# Patient Record
Sex: Male | Born: 1965 | Race: White | Hispanic: No | Marital: Married | Smoking: Former smoker
Health system: Southern US, Community
[De-identification: ages and names within clinical notes are randomized; demographics above are authoritative.]

## PROBLEM LIST (undated history)

## (undated) DIAGNOSIS — I7100 Dissection of unspecified site of aorta: Secondary | ICD-10-CM

## (undated) DIAGNOSIS — I499 Cardiac arrhythmia, unspecified: Secondary | ICD-10-CM

## (undated) DIAGNOSIS — N200 Calculus of kidney: Secondary | ICD-10-CM

## (undated) DIAGNOSIS — E785 Hyperlipidemia, unspecified: Secondary | ICD-10-CM

## (undated) DIAGNOSIS — R040 Epistaxis: Secondary | ICD-10-CM

## (undated) DIAGNOSIS — E119 Type 2 diabetes mellitus without complications: Secondary | ICD-10-CM

## (undated) DIAGNOSIS — Z87442 Personal history of urinary calculi: Secondary | ICD-10-CM

## (undated) DIAGNOSIS — I10 Essential (primary) hypertension: Secondary | ICD-10-CM

## (undated) DIAGNOSIS — I714 Abdominal aortic aneurysm, without rupture, unspecified: Secondary | ICD-10-CM

## (undated) HISTORY — PX: CYSTOSCOPY: SUR368

## (undated) HISTORY — DX: Dissection of unspecified site of aorta: I71.00

## (undated) HISTORY — DX: Abdominal aortic aneurysm, without rupture, unspecified: I71.40

## (undated) HISTORY — PX: LITHOTRIPSY: SUR834

## (undated) HISTORY — DX: Type 2 diabetes mellitus without complications: E11.9

## (undated) HISTORY — DX: Hyperlipidemia, unspecified: E78.5

## (undated) HISTORY — PX: HERNIA REPAIR: SHX51

## (undated) HISTORY — PX: KIDNEY STONE SURGERY: SHX686

---

## 2000-03-24 ENCOUNTER — Emergency Department (HOSPITAL_COMMUNITY): Admission: EM | Admit: 2000-03-24 | Discharge: 2000-03-24 | Payer: Self-pay | Admitting: Emergency Medicine

## 2010-05-30 ENCOUNTER — Emergency Department (HOSPITAL_BASED_OUTPATIENT_CLINIC_OR_DEPARTMENT_OTHER): Admission: EM | Admit: 2010-05-30 | Discharge: 2010-05-30 | Payer: Self-pay | Admitting: Emergency Medicine

## 2013-12-29 DIAGNOSIS — I10 Essential (primary) hypertension: Secondary | ICD-10-CM | POA: Insufficient documentation

## 2013-12-29 DIAGNOSIS — J309 Allergic rhinitis, unspecified: Secondary | ICD-10-CM | POA: Insufficient documentation

## 2013-12-29 DIAGNOSIS — G43909 Migraine, unspecified, not intractable, without status migrainosus: Secondary | ICD-10-CM | POA: Insufficient documentation

## 2016-12-29 DIAGNOSIS — N201 Calculus of ureter: Secondary | ICD-10-CM | POA: Insufficient documentation

## 2017-04-19 ENCOUNTER — Emergency Department (HOSPITAL_BASED_OUTPATIENT_CLINIC_OR_DEPARTMENT_OTHER)
Admission: EM | Admit: 2017-04-19 | Discharge: 2017-04-19 | Disposition: A | Discharge status: Home / Self Care | Payer: 59 | Attending: Emergency Medicine | Admitting: Emergency Medicine

## 2017-04-19 ENCOUNTER — Encounter (HOSPITAL_BASED_OUTPATIENT_CLINIC_OR_DEPARTMENT_OTHER): Payer: Self-pay | Admitting: *Deleted

## 2017-04-19 DIAGNOSIS — R040 Epistaxis: Secondary | ICD-10-CM | POA: Diagnosis not present

## 2017-04-19 DIAGNOSIS — Z79899 Other long term (current) drug therapy: Secondary | ICD-10-CM | POA: Diagnosis not present

## 2017-04-19 DIAGNOSIS — I10 Essential (primary) hypertension: Secondary | ICD-10-CM | POA: Diagnosis not present

## 2017-04-19 DIAGNOSIS — Z87891 Personal history of nicotine dependence: Secondary | ICD-10-CM | POA: Diagnosis not present

## 2017-04-19 HISTORY — DX: Essential (primary) hypertension: I10

## 2017-04-19 HISTORY — DX: Calculus of kidney: N20.0

## 2017-04-19 HISTORY — DX: Epistaxis: R04.0

## 2017-04-19 MED ORDER — OXYMETAZOLINE HCL 0.05 % NA SOLN
1.0000 | Freq: Once | NASAL | Status: AC
  Administered 2017-04-19: 1 via NASAL

## 2017-04-19 MED ORDER — OXYMETAZOLINE HCL 0.05 % NA SOLN
NASAL | Status: AC
  Filled 2017-04-19: qty 15

## 2017-04-19 MED ORDER — SILVER NITRATE-POT NITRATE 75-25 % EX MISC
CUTANEOUS | Status: AC
  Filled 2017-04-19: qty 4

## 2017-04-19 MED ORDER — SILVER NITRATE-POT NITRATE 75-25 % EX MISC
4.0000 | Freq: Once | CUTANEOUS | Status: AC
  Administered 2017-04-19: 4 via TOPICAL

## 2017-04-19 NOTE — ED Provider Notes (Signed)
[facility] Provider Note   CSN: [id number] Arrival date & time: 04/19/17  2046     History   Chief Complaint Chief Complaint  Patient presents with  . Epistaxis    HPI Patrick [name] is a 51 y.o. male.  Pt presents to the ED today with nose bleed out of left nare.  The pt has a hx of nose bleeds, but has never had to have one get packed.  The pt has had intermittent bleeding for the last few days.  He did have a ureteral stent placed today.  He is not on blood thinners.      Past Medical History:  Diagnosis Date  . Hypertension   . Kidney stone   . Nosebleed     There are no active problems to display for this patient.   Past Surgical History:  Procedure Laterality Date  . HERNIA REPAIR         Home Medications    Prior to Admission medications   Medication Sig Start Date End Date Taking? Authorizing Provider  amLODipine (NORVASC) 10 MG tablet Take 10 mg by mouth daily.   Yes [provider]  HYDROcodone-acetaminophen (NORCO/VICODIN) 5-325 MG tablet Take 1 tablet by mouth every 6 (six) hours as needed for moderate pain.   Yes [provider]  lisinopril (PRINIVIL,ZESTRIL) 20 MG tablet Take 20 mg by mouth daily.   Yes [provider]  nebivolol (BYSTOLIC) 10 MG tablet Take 10 mg by mouth daily.   Yes [provider]  phenazopyridine (PYRIDIUM) 100 MG tablet Take 100 mg by mouth 3 (three) times daily as needed for pain.   Yes [provider]  tamsulosin (FLOMAX) 0.4 MG CAPS capsule Take 0.4 mg by mouth.   Yes [provider]    Family History No family history on file.  Social History Social History  Substance Use Topics  . Smoking status: Former Games developer  . Smokeless tobacco: Not on file  . Alcohol use 1.2 oz/week    2 Cans of beer per week     Allergies   Patient has no known allergies.   Review of Systems Review of Systems  HENT: Positive for nosebleeds.   All other systems  reviewed and are negative.    Physical Exam Updated Vital Signs BP (!) 175/108   Pulse 80   Resp 18   Ht 5' 10.5" (1.791 m)   Wt 99.8 kg (220 lb)   SpO2 100%   BMI 31.12 kg/m   Physical Exam  Constitutional: He is oriented to person, place, and time. He appears well-developed and well-nourished.  HENT:  Head: Normocephalic and atraumatic.  Right Ear: External ear normal.  Left Ear: External ear normal.  Nose: Epistaxis is observed.  Mouth/Throat: Oropharynx is clear and moist.  Eyes: Pupils are equal, round, and reactive to light. Conjunctivae and EOM are normal.  Neck: Normal range of motion. Neck supple.  Cardiovascular: Normal rate, regular rhythm, normal heart sounds and intact distal pulses.   Pulmonary/Chest: Effort normal and breath sounds normal.  Abdominal: Soft. Bowel sounds are normal.  Musculoskeletal: Normal range of motion.  Neurological: He is alert and oriented to person, place, and time.  Skin: Skin is warm. Capillary refill takes less than 2 seconds.  Psychiatric: He has a normal mood and affect. His behavior is normal. Judgment and thought content normal.  Nursing note and vitals reviewed.    ED Treatments / Results  Labs (all labs ordered are listed,  but only abnormal results are displayed) Labs Reviewed - No data to display  EKG  EKG Interpretation None       Radiology No results found.  Procedures .Epistaxis Management Date/Time: 04/19/2017 10:13 PM Performed by: Jacalyn Lefevre Authorized by: Jacalyn Lefevre   Consent:    Consent obtained:  Verbal   Consent given by:  Patient   Risks discussed:  Bleeding, infection, nasal injury and pain   Alternatives discussed:  No treatment Anesthesia (see MAR for exact dosages):    Anesthesia method:  None Procedure details:    Treatment site:  L anterior   Treatment method:  Anterior pack and silver nitrate   Treatment complexity:  Limited   Treatment episode: recurring   Post-procedure  details:    Assessment:  No improvement   Patient tolerance of procedure:  Tolerated well, no immediate complications   (including critical care time)  Medications Ordered in ED Medications  silver nitrate applicators 75-25 % applicator (not administered)  oxymetazoline (AFRIN) 0.05 % nasal spray 1 spray (1 spray Each Nare Given 04/19/17 2105)  silver nitrate applicators applicator 4 Stick (4 Sticks Topical Given by Other 04/19/17 2115)     Initial Impression / Assessment and Plan / ED Course  I have reviewed the triage vital signs and the nursing notes.  Pertinent labs & imaging results that were available during my care of the patient were reviewed by me and considered in my medical decision making (see chart for details).    Pt ambulates without bleeding starting.  He is on levaquin already, so I did not put him on additional abx.  Pt is on pain meds for his ureter stent.  He is instructed to get packing removed in 2 days.  F/u with ENT.  Final Clinical Impressions(s) / ED Diagnoses   Final diagnoses:  Left-sided epistaxis    New Prescriptions New Prescriptions   No medications on file     Jacalyn Lefevre, MD 04/19/17 2221

## 2017-04-19 NOTE — ED Triage Notes (Addendum)
Pt c/o nose bleed x 10 mins

## 2017-04-19 NOTE — ED Notes (Signed)
ED Provider at bedside. 

## 2017-04-19 NOTE — ED Notes (Signed)
Pt amb around nursing station w/o bleeding

## 2017-06-16 LAB — HM COLONOSCOPY

## 2018-05-02 ENCOUNTER — Emergency Department (HOSPITAL_BASED_OUTPATIENT_CLINIC_OR_DEPARTMENT_OTHER)
Admission: EM | Admit: 2018-05-02 | Discharge: 2018-05-02 | Disposition: A | Discharge status: Home / Self Care | Payer: 59 | Attending: Emergency Medicine | Admitting: Emergency Medicine

## 2018-05-02 ENCOUNTER — Other Ambulatory Visit: Payer: Self-pay

## 2018-05-02 ENCOUNTER — Encounter (HOSPITAL_BASED_OUTPATIENT_CLINIC_OR_DEPARTMENT_OTHER): Payer: Self-pay

## 2018-05-02 ENCOUNTER — Emergency Department (HOSPITAL_BASED_OUTPATIENT_CLINIC_OR_DEPARTMENT_OTHER): Payer: 59

## 2018-05-02 DIAGNOSIS — Z79899 Other long term (current) drug therapy: Secondary | ICD-10-CM | POA: Diagnosis not present

## 2018-05-02 DIAGNOSIS — I10 Essential (primary) hypertension: Secondary | ICD-10-CM | POA: Insufficient documentation

## 2018-05-02 DIAGNOSIS — R208 Other disturbances of skin sensation: Secondary | ICD-10-CM | POA: Diagnosis present

## 2018-05-02 DIAGNOSIS — Z87891 Personal history of nicotine dependence: Secondary | ICD-10-CM | POA: Diagnosis not present

## 2018-05-02 DIAGNOSIS — R6889 Other general symptoms and signs: Secondary | ICD-10-CM

## 2018-05-02 LAB — CBC WITH DIFFERENTIAL/PLATELET
ABS IMMATURE GRANULOCYTES: 0.03 10*3/uL (ref 0.00–0.07)
Basophils Absolute: 0 10*3/uL (ref 0.0–0.1)
Basophils Relative: 0 %
Eosinophils Absolute: 0.1 10*3/uL (ref 0.0–0.5)
Eosinophils Relative: 2 %
HEMATOCRIT: 44.9 % (ref 39.0–52.0)
HEMOGLOBIN: 15.8 g/dL (ref 13.0–17.0)
IMMATURE GRANULOCYTES: 0 %
LYMPHS ABS: 1.8 10*3/uL (ref 0.7–4.0)
LYMPHS PCT: 21 %
MCH: 29.9 pg (ref 26.0–34.0)
MCHC: 35.2 g/dL (ref 30.0–36.0)
MCV: 85 fL (ref 80.0–100.0)
MONO ABS: 0.7 10*3/uL (ref 0.1–1.0)
MONOS PCT: 8 %
NEUTROS ABS: 6.1 10*3/uL (ref 1.7–7.7)
NEUTROS PCT: 69 %
NRBC: 0 % (ref 0.0–0.2)
PLATELETS: 161 10*3/uL (ref 150–400)
RBC: 5.28 MIL/uL (ref 4.22–5.81)
RDW: 12.2 % (ref 11.5–15.5)
WBC: 8.8 10*3/uL (ref 4.0–10.5)

## 2018-05-02 LAB — TSH: TSH: 1.53 u[IU]/mL (ref 0.350–4.500)

## 2018-05-02 LAB — BASIC METABOLIC PANEL
ANION GAP: 11 (ref 5–15)
BUN: 20 mg/dL (ref 6–20)
CALCIUM: 9.3 mg/dL (ref 8.9–10.3)
CHLORIDE: 100 mmol/L (ref 98–111)
CO2: 28 mmol/L (ref 22–32)
Creatinine, Ser: 0.96 mg/dL (ref 0.61–1.24)
GFR calc non Af Amer: >60 mL/min (ref >60)
GLUCOSE: 119 mg/dL — AB (ref 70–99)
POTASSIUM: 3.3 mmol/L — AB (ref 3.5–5.1)
Sodium: 139 mmol/L (ref 135–145)

## 2018-05-02 LAB — TROPONIN I
Troponin I: <0.03 ng/mL (ref <0.03)
Troponin I: <0.03 ng/mL (ref <0.03)

## 2018-05-02 MED ORDER — POTASSIUM CHLORIDE CRYS ER 20 MEQ PO TBCR
40.0000 meq | EXTENDED_RELEASE_TABLET | Freq: Once | ORAL | Status: AC
  Administered 2018-05-02: 40 meq via ORAL
  Filled 2018-05-02: qty 2

## 2018-05-02 NOTE — ED Notes (Signed)
Pt on monitor 

## 2018-05-02 NOTE — Discharge Instructions (Signed)
Follow-up with your primary doctor and/or cardiology to discuss further evaluation of your symptoms.

## 2018-05-02 NOTE — ED Notes (Signed)
Patient transported to X-ray 

## 2018-05-02 NOTE — ED Notes (Signed)
Attempted IV to R AC and R hand, tol well, unsuccessful

## 2018-05-02 NOTE — ED Provider Notes (Signed)
[facility] EMERGENCY DEPARTMENT Provider Note   CSN: [id number] Arrival date & time: 05/02/18  1152     History   Chief Complaint Chief Complaint  Patient presents with  . Hot Flashes    HPI Libero Puthoff is a 52 y.o. male.  Patient with history of high blood pressure, kidney stone, abnormal EKG for which she was seen by cardiology in years past and was cleared per his report presents with a few episodes of feeling extremely hot.  No diaphoresis or chest pain.  Mild palpitations.  These episodes were brief earlier today and the last one was prior to arrival.  No exertional symptoms.  Patient exercises regularly without chest pain or significant shortness of breath.  No blood clot history no recent surgeries.  Patient has had some weight changes however no thyroid history known.  Patient denies illegal drugs, regular alcohol use.     Past Medical History:  Diagnosis Date  . Hypertension   . Kidney stone   . Nosebleed     There are no active problems to display for this patient.   Past Surgical History:  Procedure Laterality Date  . HERNIA REPAIR          Home Medications    Prior to Admission medications   Medication Sig Start Date End Date Taking? Authorizing Provider  amLODipine (NORVASC) 10 MG tablet Take 10 mg by mouth daily.   Yes [provider]  lisinopril (PRINIVIL,ZESTRIL) 20 MG tablet Take 20 mg by mouth daily.   Yes [provider]  nebivolol (BYSTOLIC) 10 MG tablet Take 10 mg by mouth daily.   Yes [provider]  HYDROcodone-acetaminophen (NORCO/VICODIN) 5-325 MG tablet Take 1 tablet by mouth every 6 (six) hours as needed for moderate pain.    [provider]  phenazopyridine (PYRIDIUM) 100 MG tablet Take 100 mg by mouth 3 (three) times daily as needed for pain.    [provider]  tamsulosin (FLOMAX) 0.4 MG CAPS capsule Take 0.4 mg by mouth.    [provider]    Family History No family  history on file.  Social History Social History   Tobacco Use  . Smoking status: Former Games developer  . Smokeless tobacco: Never Used  Substance Use Topics  . Alcohol use: Yes    Comment: daily  . Drug use: No     Allergies   Patient has no known allergies.   Review of Systems Review of Systems  Constitutional: Negative for chills and fever.  HENT: Negative for congestion.   Eyes: Negative for visual disturbance.  Respiratory: Negative for shortness of breath.   Cardiovascular: Positive for palpitations. Negative for chest pain.  Gastrointestinal: Negative for abdominal pain and vomiting.  Genitourinary: Negative for dysuria and flank pain.  Musculoskeletal: Negative for back pain, neck pain and neck stiffness.  Skin: Negative for rash.  Neurological: Negative for syncope, light-headedness and headaches.     Physical Exam Updated Vital Signs BP (!) 147/83   Pulse (!) 44   Temp 97.7 F (36.5 C) (Oral)   Resp 13   Ht 5\' 10"  (1.778 m)   Wt 101.6 kg   SpO2 99%   BMI 32.14 kg/m   Physical Exam  Constitutional: He is oriented to person, place, and time. He appears well-developed and well-nourished.  HENT:  Head: Normocephalic and atraumatic.  Eyes: Conjunctivae are normal. Right eye exhibits no discharge. Left eye exhibits no discharge.  Neck: Normal range of motion. Neck supple.  No tracheal deviation present.  Cardiovascular: Regular rhythm and intact distal pulses. Bradycardia present.  No murmur heard. Pulmonary/Chest: Effort normal and breath sounds normal.  Abdominal: Soft. He exhibits no distension. There is no tenderness. There is no guarding.  Musculoskeletal: He exhibits no edema.  Neurological: He is alert and oriented to person, place, and time.  Skin: Skin is warm. No rash noted.  Psychiatric: He has a normal mood and affect.  Nursing note and vitals reviewed.    ED Treatments / Results  Labs (all labs ordered are listed, but only abnormal results  are displayed) Labs Reviewed  BASIC METABOLIC PANEL - Abnormal; Notable for the following components:      Result Value   Potassium 3.3 (*)    Glucose, Bld 119 (*)    All other components within normal limits  TROPONIN I  CBC WITH DIFFERENTIAL/PLATELET  CBC WITH DIFFERENTIAL/PLATELET  TSH  TROPONIN I    EKG EKG Interpretation  Date/Time:  Wednesday May 02 2018 12:11:34 EDT Ventricular Rate:  48 PR Interval:    QRS Duration: 91 QT Interval:  473 QTC Calculation: 423 R Axis:   76 Text Interpretation:  Sinus bradycardia Borderline repolarization abnormality Confirmed by Blane Ohara [phone]) on 05/02/2018 12:42:12 PM Also confirmed by Blane Ohara [phone])  on 05/02/2018 12:49:38 PM   Radiology Dg Chest 2 View  Result Date: 05/02/2018 CLINICAL DATA:  Cardiac palpitations EXAM: CHEST - 2 VIEW COMPARISON:  September 15, 2009 FINDINGS: There is no edema or consolidation. Heart is borderline enlarged with pulmonary vascularity normal. No adenopathy. Aorta is mildly tortuous. No evident adenopathy. No bone lesions. IMPRESSION: No edema or consolidation.  Heart borderline enlarged. Electronically Signed   By: Bretta Bang III M.D.   On: 05/02/2018 13:46    Procedures Procedures (including critical care time)  Medications Ordered in ED Medications  potassium chloride SA (K-DUR,KLOR-CON) CR tablet 40 mEq (has no administration in time range)     Initial Impression / Assessment and Plan / ED Course  I have reviewed the triage vital signs and the nursing notes.  Pertinent labs & imaging results that were available during my care of the patient were reviewed by me and considered in my medical decision making (see chart for details).     Well-appearing patient presents after episodes of feeling extremely hot these have since resolved.  No obvious clinical reason at this time.  Plan for cardiac screen, blood work reviewed no acute findings, thyroid testing pending.  Plan for  delta troponin.  Discussed patient will need further cardiac evaluation in addition to his primary care doctor. EKG is nonspecific findings, unable to visualize an old EKG.  Discussed this with this patient. Blood work reviewed mild hypokalemia oral potassium supplemented. Patient well-appearing the ER no chest pain.  Plan for delta troponin and if negative patient will follow closely with local cardiology/primary doctor. Final Clinical Impressions(s) / ED Diagnoses   Final diagnoses:  Sensation of feeling hot    ED Discharge Orders    None       Blane Ohara, MD 05/02/18 1459

## 2018-05-02 NOTE — ED Triage Notes (Signed)
Pt states he had episode today and yesterday where he feels flushed and turns red-to day he felt like he was going to pass out-denies pain-states he "feels anxious" denies hx of dx anxiety-NAD-steady gait

## 2018-06-19 DIAGNOSIS — E876 Hypokalemia: Secondary | ICD-10-CM | POA: Insufficient documentation

## 2018-12-18 DIAGNOSIS — N200 Calculus of kidney: Secondary | ICD-10-CM | POA: Insufficient documentation

## 2020-09-27 IMAGING — CR DG CHEST 2V
2 series · 2 of 2 positions shown · non-contrast
Comparison: September 15, 2009

CLINICAL DATA: Cardiac palpitations

EXAM:
CHEST - 2 VIEW

[w chest pa]
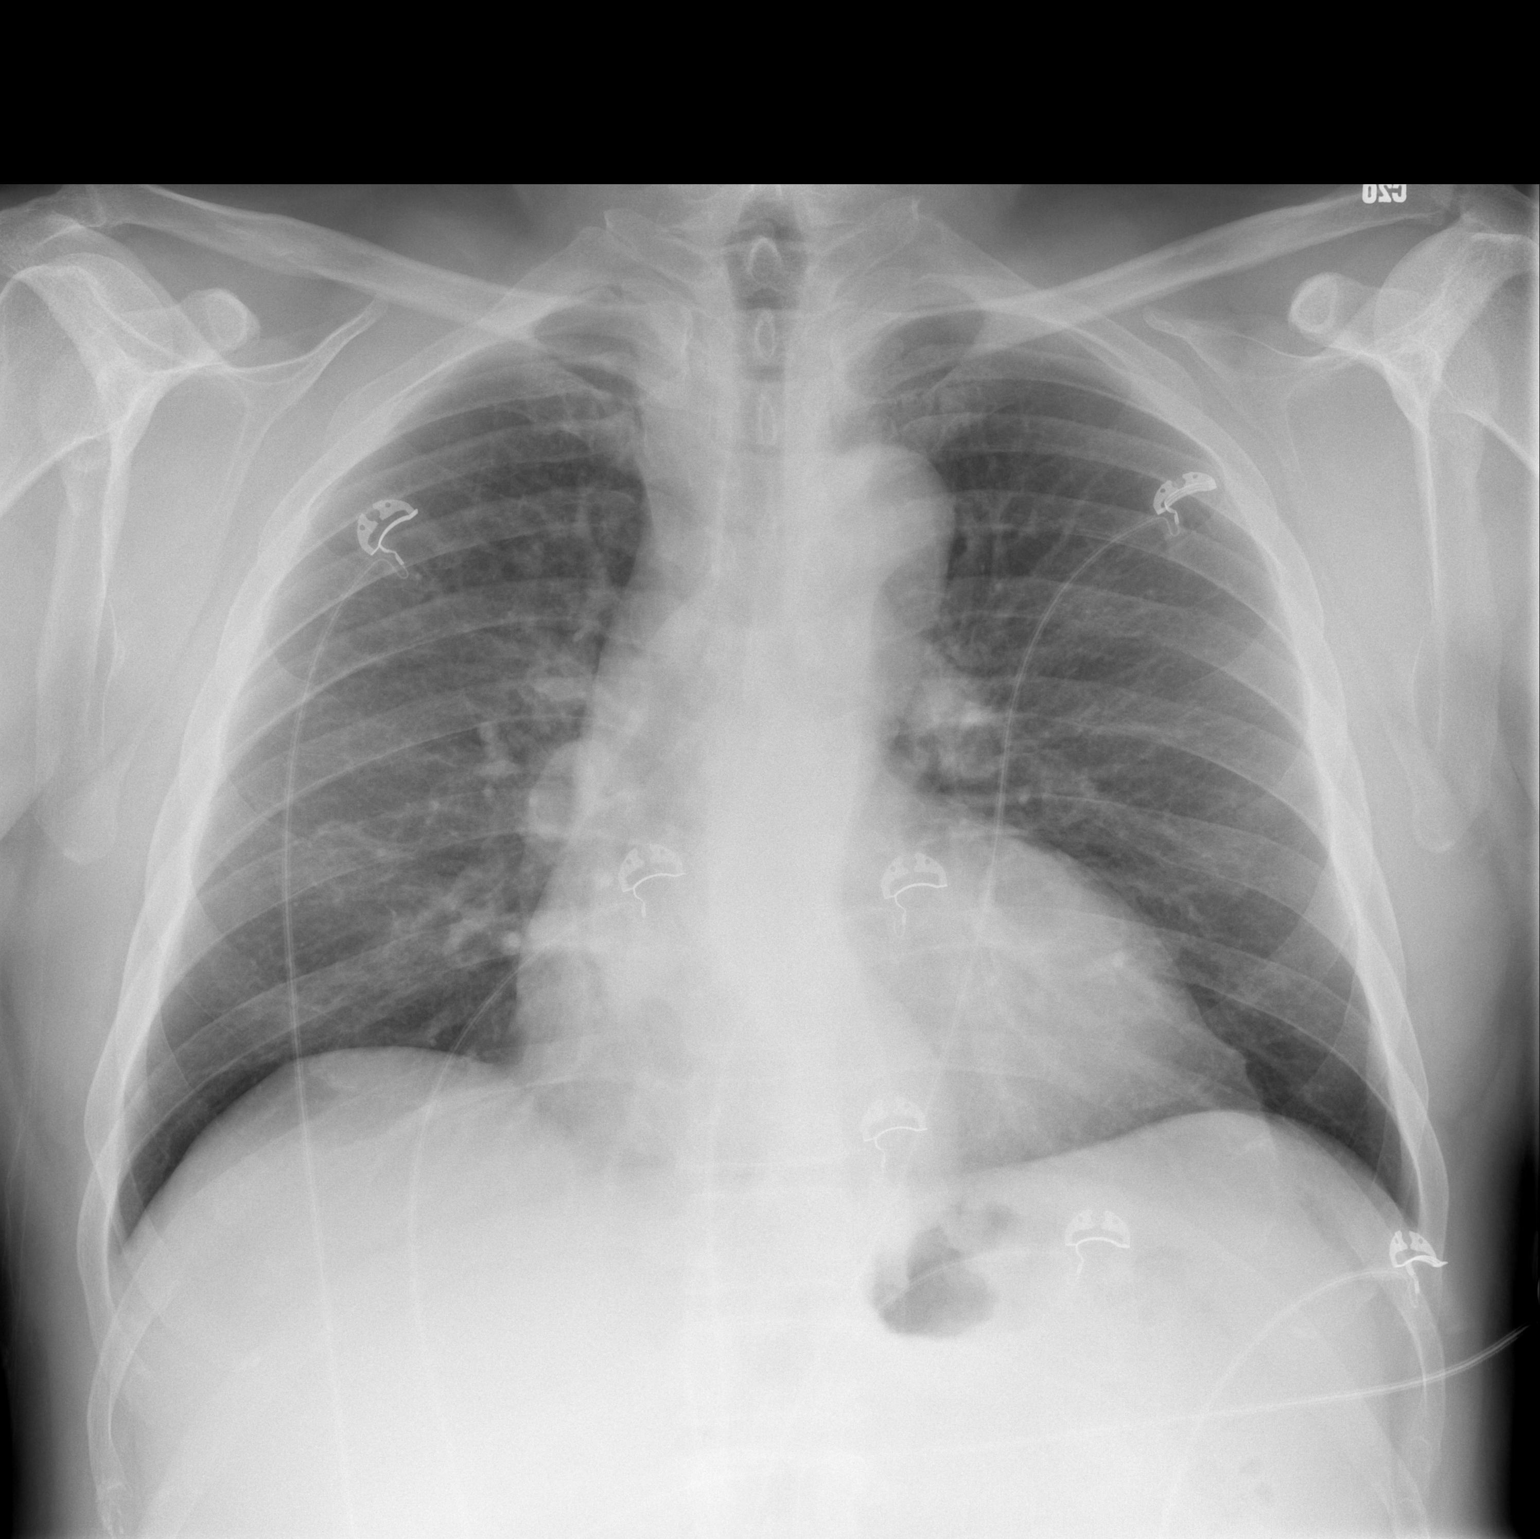

[w chest lat]
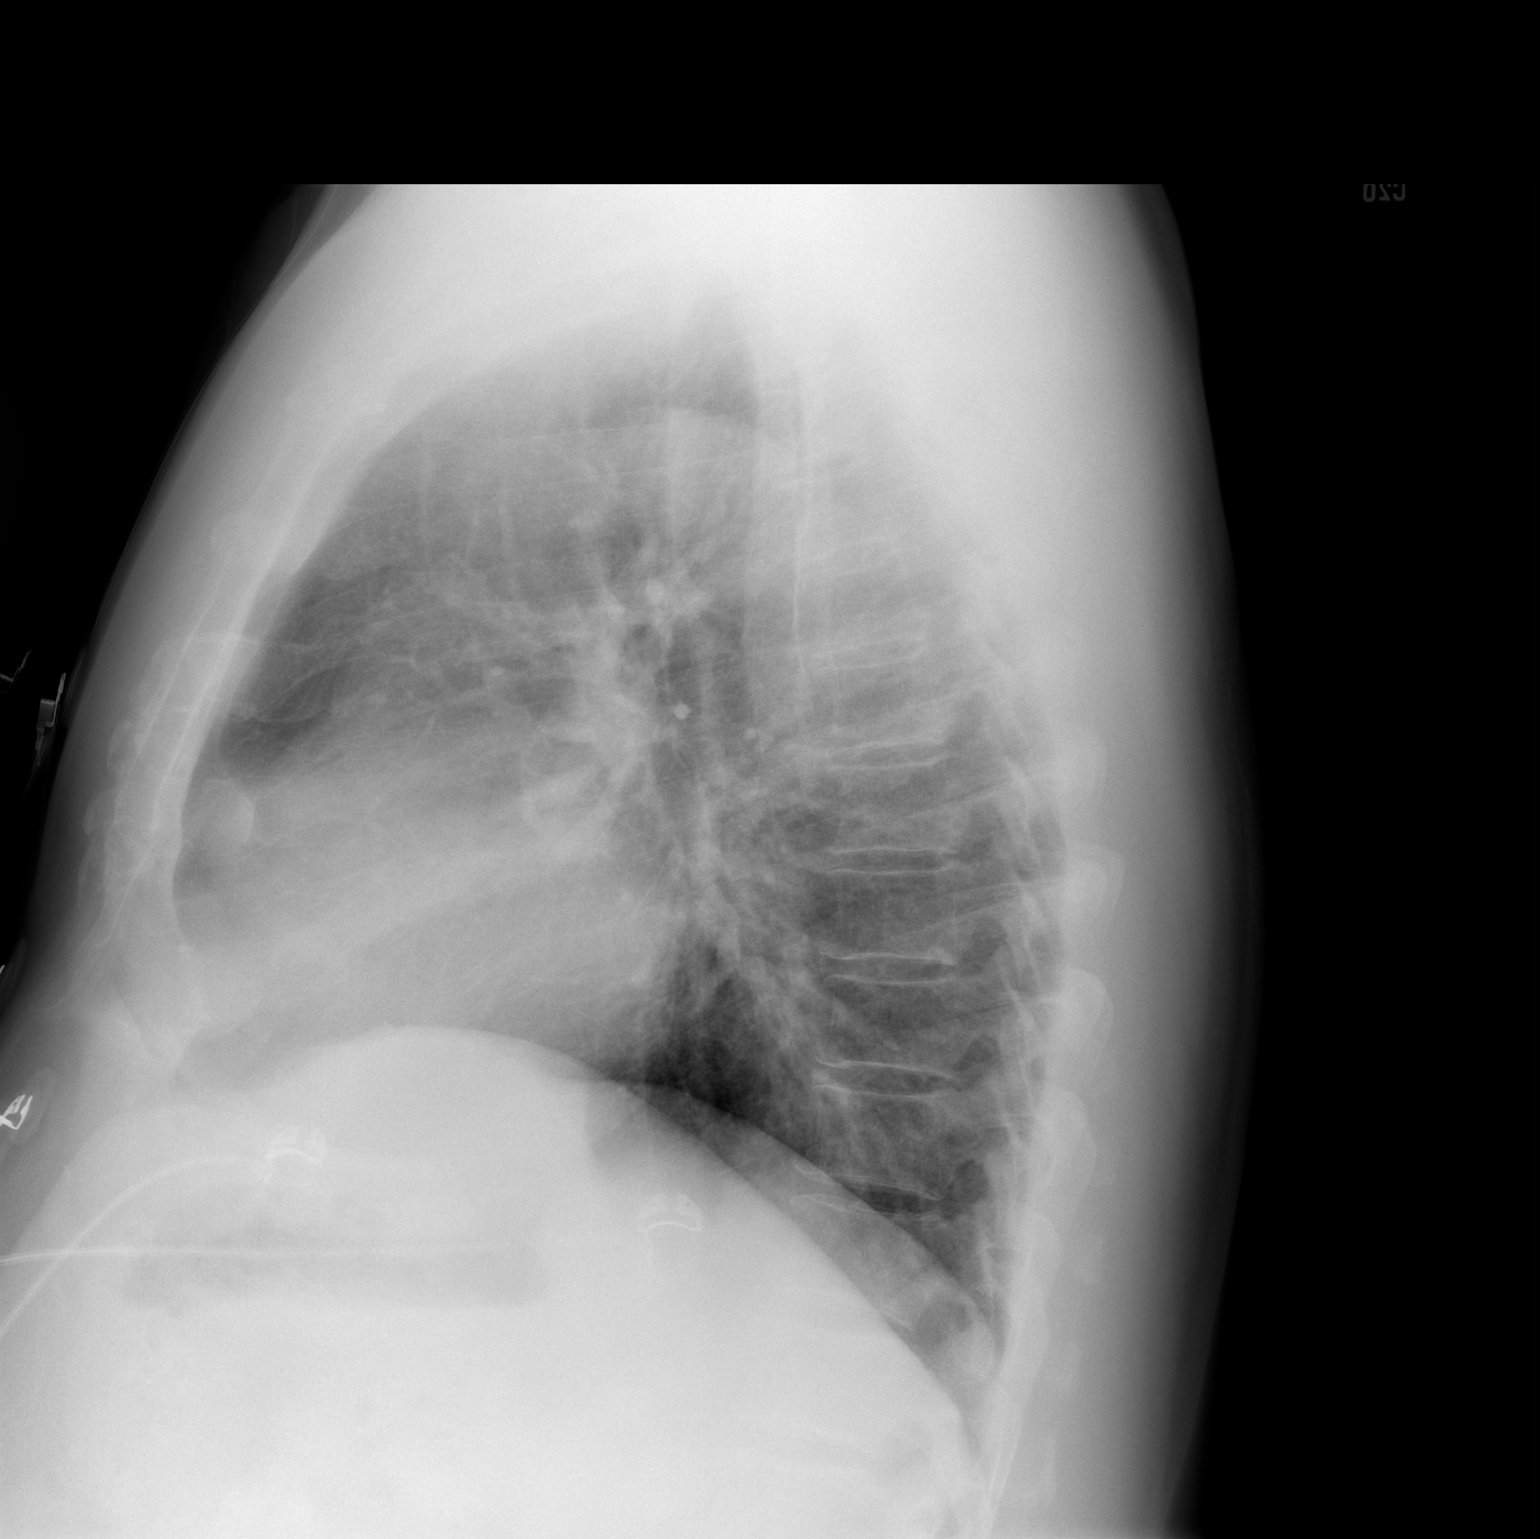

[2 of 2 positions shown; findings below may reference images not displayed]

FINDINGS: There is no edema or consolidation. Heart is borderline enlarged
with pulmonary vascularity normal. No adenopathy. Aorta is mildly
tortuous. No evident adenopathy. No bone lesions.
IMPRESSION: No edema or consolidation.  Heart borderline enlarged.

## 2021-03-11 DIAGNOSIS — J329 Chronic sinusitis, unspecified: Secondary | ICD-10-CM | POA: Diagnosis not present

## 2021-03-11 DIAGNOSIS — Z20822 Contact with and (suspected) exposure to covid-19: Secondary | ICD-10-CM | POA: Diagnosis not present

## 2021-03-11 DIAGNOSIS — R030 Elevated blood-pressure reading, without diagnosis of hypertension: Secondary | ICD-10-CM | POA: Diagnosis not present

## 2021-03-11 DIAGNOSIS — J40 Bronchitis, not specified as acute or chronic: Secondary | ICD-10-CM | POA: Diagnosis not present

## 2021-05-19 DIAGNOSIS — I10 Essential (primary) hypertension: Secondary | ICD-10-CM | POA: Diagnosis not present

## 2021-05-19 DIAGNOSIS — E8881 Metabolic syndrome: Secondary | ICD-10-CM | POA: Diagnosis not present

## 2021-07-13 DIAGNOSIS — I10 Essential (primary) hypertension: Secondary | ICD-10-CM | POA: Diagnosis not present

## 2021-07-28 DIAGNOSIS — I10 Essential (primary) hypertension: Secondary | ICD-10-CM | POA: Diagnosis not present

## 2022-05-05 DIAGNOSIS — M79672 Pain in left foot: Secondary | ICD-10-CM | POA: Diagnosis not present

## 2022-05-05 DIAGNOSIS — L02612 Cutaneous abscess of left foot: Secondary | ICD-10-CM | POA: Diagnosis not present

## 2022-06-10 DIAGNOSIS — M79644 Pain in right finger(s): Secondary | ICD-10-CM | POA: Diagnosis not present

## 2022-06-10 DIAGNOSIS — M50323 Other cervical disc degeneration at C6-C7 level: Secondary | ICD-10-CM | POA: Diagnosis not present

## 2022-06-10 DIAGNOSIS — R200 Anesthesia of skin: Secondary | ICD-10-CM | POA: Diagnosis not present

## 2022-06-10 DIAGNOSIS — R202 Paresthesia of skin: Secondary | ICD-10-CM | POA: Diagnosis not present

## 2022-09-03 LAB — HM DIABETES EYE EXAM

## 2022-09-18 ENCOUNTER — Other Ambulatory Visit: Payer: Self-pay

## 2022-09-18 ENCOUNTER — Emergency Department (HOSPITAL_BASED_OUTPATIENT_CLINIC_OR_DEPARTMENT_OTHER): Payer: BC Managed Care – PPO

## 2022-09-18 ENCOUNTER — Inpatient Hospital Stay (HOSPITAL_BASED_OUTPATIENT_CLINIC_OR_DEPARTMENT_OTHER)
Admission: EM | Admit: 2022-09-18 | Discharge: 2022-09-24 | DRG: 299 | Disposition: A | Discharge status: Home / Self Care | Payer: BC Managed Care – PPO | Attending: Internal Medicine | Admitting: Internal Medicine

## 2022-09-18 DIAGNOSIS — Z7984 Long term (current) use of oral hypoglycemic drugs: Secondary | ICD-10-CM

## 2022-09-18 DIAGNOSIS — Z8249 Family history of ischemic heart disease and other diseases of the circulatory system: Secondary | ICD-10-CM | POA: Diagnosis not present

## 2022-09-18 DIAGNOSIS — E785 Hyperlipidemia, unspecified: Secondary | ICD-10-CM

## 2022-09-18 DIAGNOSIS — I2510 Atherosclerotic heart disease of native coronary artery without angina pectoris: Secondary | ICD-10-CM | POA: Diagnosis not present

## 2022-09-18 DIAGNOSIS — I10 Essential (primary) hypertension: Secondary | ICD-10-CM | POA: Diagnosis not present

## 2022-09-18 DIAGNOSIS — F10139 Alcohol abuse with withdrawal, unspecified: Secondary | ICD-10-CM | POA: Diagnosis not present

## 2022-09-18 DIAGNOSIS — K8020 Calculus of gallbladder without cholecystitis without obstruction: Secondary | ICD-10-CM | POA: Diagnosis not present

## 2022-09-18 DIAGNOSIS — E871 Hypo-osmolality and hyponatremia: Secondary | ICD-10-CM | POA: Diagnosis not present

## 2022-09-18 DIAGNOSIS — R079 Chest pain, unspecified: Secondary | ICD-10-CM | POA: Diagnosis not present

## 2022-09-18 DIAGNOSIS — I447 Left bundle-branch block, unspecified: Secondary | ICD-10-CM | POA: Diagnosis not present

## 2022-09-18 DIAGNOSIS — K858 Other acute pancreatitis without necrosis or infection: Secondary | ICD-10-CM | POA: Diagnosis not present

## 2022-09-18 DIAGNOSIS — E781 Pure hyperglyceridemia: Secondary | ICD-10-CM | POA: Diagnosis present

## 2022-09-18 DIAGNOSIS — Z6833 Body mass index (BMI) 33.0-33.9, adult: Secondary | ICD-10-CM | POA: Diagnosis not present

## 2022-09-18 DIAGNOSIS — I7102 Dissection of abdominal aorta: Principal | ICD-10-CM | POA: Diagnosis present

## 2022-09-18 DIAGNOSIS — E876 Hypokalemia: Secondary | ICD-10-CM | POA: Diagnosis not present

## 2022-09-18 DIAGNOSIS — R0602 Shortness of breath: Secondary | ICD-10-CM | POA: Diagnosis not present

## 2022-09-18 DIAGNOSIS — Z87891 Personal history of nicotine dependence: Secondary | ICD-10-CM | POA: Diagnosis not present

## 2022-09-18 DIAGNOSIS — E669 Obesity, unspecified: Secondary | ICD-10-CM | POA: Diagnosis present

## 2022-09-18 DIAGNOSIS — K859 Acute pancreatitis without necrosis or infection, unspecified: Secondary | ICD-10-CM | POA: Diagnosis not present

## 2022-09-18 DIAGNOSIS — E1165 Type 2 diabetes mellitus with hyperglycemia: Secondary | ICD-10-CM | POA: Diagnosis present

## 2022-09-18 NOTE — ED Triage Notes (Addendum)
Pt arrives with c/o chest pain that started earlier tonight. Pt tried some OTC meds without relief. Pt denies SOB. Pt endorses nausea. Per pt, pain radiates to his back.

## 2022-09-18 NOTE — ED Notes (Addendum)
Patient advised he self administered '324mg'$  of ASA PTA

## 2022-09-19 ENCOUNTER — Emergency Department (HOSPITAL_BASED_OUTPATIENT_CLINIC_OR_DEPARTMENT_OTHER): Payer: BC Managed Care – PPO

## 2022-09-19 ENCOUNTER — Encounter (HOSPITAL_BASED_OUTPATIENT_CLINIC_OR_DEPARTMENT_OTHER): Payer: Self-pay

## 2022-09-19 DIAGNOSIS — E669 Obesity, unspecified: Secondary | ICD-10-CM | POA: Diagnosis present

## 2022-09-19 DIAGNOSIS — Z6833 Body mass index (BMI) 33.0-33.9, adult: Secondary | ICD-10-CM | POA: Diagnosis not present

## 2022-09-19 DIAGNOSIS — E781 Pure hyperglyceridemia: Secondary | ICD-10-CM | POA: Diagnosis present

## 2022-09-19 DIAGNOSIS — E785 Hyperlipidemia, unspecified: Secondary | ICD-10-CM | POA: Diagnosis not present

## 2022-09-19 DIAGNOSIS — Z87891 Personal history of nicotine dependence: Secondary | ICD-10-CM | POA: Diagnosis not present

## 2022-09-19 DIAGNOSIS — I447 Left bundle-branch block, unspecified: Secondary | ICD-10-CM | POA: Diagnosis present

## 2022-09-19 DIAGNOSIS — K859 Acute pancreatitis without necrosis or infection, unspecified: Secondary | ICD-10-CM | POA: Diagnosis not present

## 2022-09-19 DIAGNOSIS — E871 Hypo-osmolality and hyponatremia: Secondary | ICD-10-CM | POA: Diagnosis present

## 2022-09-19 DIAGNOSIS — E876 Hypokalemia: Secondary | ICD-10-CM | POA: Diagnosis not present

## 2022-09-19 DIAGNOSIS — Z7984 Long term (current) use of oral hypoglycemic drugs: Secondary | ICD-10-CM | POA: Diagnosis not present

## 2022-09-19 DIAGNOSIS — K8020 Calculus of gallbladder without cholecystitis without obstruction: Secondary | ICD-10-CM | POA: Diagnosis present

## 2022-09-19 DIAGNOSIS — I7102 Dissection of abdominal aorta: Secondary | ICD-10-CM | POA: Diagnosis present

## 2022-09-19 DIAGNOSIS — K858 Other acute pancreatitis without necrosis or infection: Secondary | ICD-10-CM | POA: Diagnosis present

## 2022-09-19 DIAGNOSIS — I10 Essential (primary) hypertension: Secondary | ICD-10-CM | POA: Diagnosis present

## 2022-09-19 DIAGNOSIS — R079 Chest pain, unspecified: Secondary | ICD-10-CM | POA: Diagnosis present

## 2022-09-19 DIAGNOSIS — E1165 Type 2 diabetes mellitus with hyperglycemia: Secondary | ICD-10-CM | POA: Diagnosis present

## 2022-09-19 DIAGNOSIS — Z8249 Family history of ischemic heart disease and other diseases of the circulatory system: Secondary | ICD-10-CM | POA: Diagnosis not present

## 2022-09-19 DIAGNOSIS — F10139 Alcohol abuse with withdrawal, unspecified: Secondary | ICD-10-CM | POA: Diagnosis not present

## 2022-09-19 LAB — LACTIC ACID, PLASMA
Lactic Acid, Venous: 2.4 mmol/L (ref 0.5–1.9)
Lactic Acid, Venous: 2.2 mmol/L (ref 0.5–1.9)
Lactic Acid, Venous: 2 mmol/L (ref 0.5–1.9)

## 2022-09-19 LAB — CBC WITH DIFFERENTIAL/PLATELET
Abs Immature Granulocytes: 0.09 10*3/uL — ABNORMAL HIGH (ref 0.00–0.07)
Abs Immature Granulocytes: 0.04 10*3/uL (ref 0.00–0.07)
Basophils Absolute: 0.1 10*3/uL (ref 0.0–0.1)
Basophils Absolute: 0 10*3/uL (ref 0.0–0.1)
Basophils Relative: 0 %
Basophils Relative: 0 %
Eosinophils Absolute: 0.4 10*3/uL (ref 0.0–0.5)
Eosinophils Absolute: 0.3 10*3/uL (ref 0.0–0.5)
Eosinophils Relative: 3 %
Eosinophils Relative: 3 %
HCT: 44.3 % (ref 39.0–52.0)
HCT: 39.8 % (ref 39.0–52.0)
Hemoglobin: 16.1 g/dL (ref 13.0–17.0)
Hemoglobin: 14.9 g/dL (ref 13.0–17.0)
Immature Granulocytes: 1 %
Immature Granulocytes: 0 %
Lymphocytes Relative: 24 %
Lymphocytes Relative: 23 %
Lymphs Abs: 2.9 10*3/uL (ref 0.7–4.0)
Lymphs Abs: 2.4 10*3/uL (ref 0.7–4.0)
MCH: 29.8 pg (ref 26.0–34.0)
MCH: 31 pg (ref 26.0–34.0)
MCHC: 36.3 g/dL — ABNORMAL HIGH (ref 30.0–36.0)
MCHC: 37.4 g/dL — ABNORMAL HIGH (ref 30.0–36.0)
MCV: 81.9 fL (ref 80.0–100.0)
MCV: 82.9 fL (ref 80.0–100.0)
Monocytes Absolute: 1.1 10*3/uL — ABNORMAL HIGH (ref 0.1–1.0)
Monocytes Absolute: 0.7 10*3/uL (ref 0.1–1.0)
Monocytes Relative: 9 %
Monocytes Relative: 7 %
Neutro Abs: 7.8 10*3/uL — ABNORMAL HIGH (ref 1.7–7.7)
Neutro Abs: 6.9 10*3/uL (ref 1.7–7.7)
Neutrophils Relative %: 63 %
Neutrophils Relative %: 67 %
Platelets: 212 10*3/uL (ref 150–400)
Platelets: 170 10*3/uL (ref 150–400)
RBC: 5.41 MIL/uL (ref 4.22–5.81)
RBC: 4.8 MIL/uL (ref 4.22–5.81)
RDW: 12.5 % (ref 11.5–15.5)
RDW: 12.4 % (ref 11.5–15.5)
Smear Review: NORMAL
WBC: 12.5 10*3/uL — ABNORMAL HIGH (ref 4.0–10.5)
WBC: 10.4 10*3/uL (ref 4.0–10.5)
nRBC: 0.2 % (ref 0.0–0.2)
nRBC: 0 % (ref 0.0–0.2)

## 2022-09-19 LAB — COMPREHENSIVE METABOLIC PANEL
ALT: 16 U/L (ref 0–44)
AST: 26 U/L (ref 15–41)
Albumin: 3.6 g/dL (ref 3.5–5.0)
Alkaline Phosphatase: 80 U/L (ref 38–126)
Anion gap: 16 — ABNORMAL HIGH (ref 5–15)
BUN: 16 mg/dL (ref 6–20)
CO2: 18 mmol/L — ABNORMAL LOW (ref 22–32)
Calcium: 8.4 mg/dL — ABNORMAL LOW (ref 8.9–10.3)
Chloride: 102 mmol/L (ref 98–111)
Creatinine, Ser: 0.89 mg/dL (ref 0.61–1.24)
GFR, Estimated: >60 mL/min (ref >60)
Glucose, Bld: 318 mg/dL — ABNORMAL HIGH (ref 70–99)
Potassium: 4.5 mmol/L (ref 3.5–5.1)
Sodium: 136 mmol/L (ref 135–145)
Total Bilirubin: 1.2 mg/dL (ref 0.3–1.2)
Total Protein: 6 g/dL — ABNORMAL LOW (ref 6.5–8.1)

## 2022-09-19 LAB — GLUCOSE, CAPILLARY
Glucose-Capillary: 314 mg/dL — ABNORMAL HIGH (ref 70–99)
Glucose-Capillary: 277 mg/dL — ABNORMAL HIGH (ref 70–99)
Glucose-Capillary: 265 mg/dL — ABNORMAL HIGH (ref 70–99)
Glucose-Capillary: 181 mg/dL — ABNORMAL HIGH (ref 70–99)
Glucose-Capillary: 129 mg/dL — ABNORMAL HIGH (ref 70–99)
Glucose-Capillary: 174 mg/dL — ABNORMAL HIGH (ref 70–99)
Glucose-Capillary: 107 mg/dL — ABNORMAL HIGH (ref 70–99)
Glucose-Capillary: 111 mg/dL — ABNORMAL HIGH (ref 70–99)
Glucose-Capillary: 109 mg/dL — ABNORMAL HIGH (ref 70–99)
Glucose-Capillary: 120 mg/dL — ABNORMAL HIGH (ref 70–99)
Glucose-Capillary: 110 mg/dL — ABNORMAL HIGH (ref 70–99)

## 2022-09-19 LAB — TRIGLYCERIDES
Triglycerides: 3512 mg/dL — ABNORMAL HIGH (ref <150)
Triglycerides: 2123 mg/dL — ABNORMAL HIGH (ref <150)

## 2022-09-19 LAB — BASIC METABOLIC PANEL
Anion gap: 11 (ref 5–15)
Anion gap: 12 (ref 5–15)
BUN: 19 mg/dL (ref 6–20)
BUN: 14 mg/dL (ref 6–20)
CO2: 27 mmol/L (ref 22–32)
CO2: 22 mmol/L (ref 22–32)
Calcium: 9.5 mg/dL (ref 8.9–10.3)
Calcium: 8.2 mg/dL — ABNORMAL LOW (ref 8.9–10.3)
Chloride: 97 mmol/L — ABNORMAL LOW (ref 98–111)
Chloride: 100 mmol/L (ref 98–111)
Creatinine, Ser: 0.99 mg/dL (ref 0.61–1.24)
Creatinine, Ser: 0.62 mg/dL (ref 0.61–1.24)
GFR, Estimated: >60 mL/min (ref >60)
GFR, Estimated: >60 mL/min (ref >60)
Glucose, Bld: 358 mg/dL — ABNORMAL HIGH (ref 70–99)
Glucose, Bld: 211 mg/dL — ABNORMAL HIGH (ref 70–99)
Potassium: 4.5 mmol/L (ref 3.5–5.1)
Potassium: 3.6 mmol/L (ref 3.5–5.1)
Sodium: 135 mmol/L (ref 135–145)
Sodium: 134 mmol/L — ABNORMAL LOW (ref 135–145)

## 2022-09-19 LAB — TROPONIN I (HIGH SENSITIVITY)
Troponin I (High Sensitivity): 8 ng/L
Troponin I (High Sensitivity): 8 ng/L (ref <18)

## 2022-09-19 LAB — HEMOGLOBIN A1C
Hgb A1c MFr Bld: 10.2 % — ABNORMAL HIGH (ref 4.8–5.6)
Mean Plasma Glucose: 246 mg/dL

## 2022-09-19 LAB — LIPID PANEL
Cholesterol: 599 mg/dL — ABNORMAL HIGH (ref 0–200)
LDL Cholesterol: UNDETERMINED mg/dL (ref 0–99)
Triglycerides: >5000 mg/dL — ABNORMAL HIGH
VLDL: UNDETERMINED mg/dL (ref 0–40)

## 2022-09-19 LAB — HEPATIC FUNCTION PANEL
ALT: 25 U/L (ref 0–44)
AST: 24 U/L (ref 15–41)
Albumin: 4.5 g/dL (ref 3.5–5.0)
Alkaline Phosphatase: 105 U/L (ref 38–126)
Bilirubin, Direct: 0.3 mg/dL — ABNORMAL HIGH (ref 0.0–0.2)
Indirect Bilirubin: 0.8 mg/dL (ref 0.3–0.9)
Total Bilirubin: 1.1 mg/dL (ref 0.3–1.2)
Total Protein: 7.2 g/dL (ref 6.5–8.1)

## 2022-09-19 LAB — LDL CHOLESTEROL, DIRECT: Direct LDL: <10 mg/dL (ref 0–99)

## 2022-09-19 LAB — LIPASE, BLOOD: Lipase: 30 U/L (ref 11–51)

## 2022-09-19 LAB — HIV ANTIBODY (ROUTINE TESTING W REFLEX): HIV Screen 4th Generation wRfx: NONREACTIVE

## 2022-09-19 LAB — PHOSPHORUS: Phosphorus: 3.6 mg/dL (ref 2.5–4.6)

## 2022-09-19 LAB — MAGNESIUM: Magnesium: 2 mg/dL (ref 1.7–2.4)

## 2022-09-19 LAB — MRSA NEXT GEN BY PCR, NASAL: MRSA by PCR Next Gen: NOT DETECTED

## 2022-09-19 MED ORDER — DEXTROSE 5 % IV SOLN: INTRAVENOUS | Status: DC

## 2022-09-19 MED ORDER — ACETAMINOPHEN 325 MG PO TABS
650.0000 mg | ORAL_TABLET | Freq: Four times a day (QID) | ORAL | Status: DC | PRN
  Administered 2022-09-19 – 2022-09-23 (×7): 650 mg via ORAL
  Filled 2022-09-19 (×7): qty 2

## 2022-09-19 MED ORDER — IOHEXOL 350 MG/ML SOLN
100.0000 mL | Freq: Once | INTRAVENOUS | Status: AC | PRN
  Administered 2022-09-19: 100 mL via INTRAVENOUS

## 2022-09-19 MED ORDER — AMLODIPINE BESYLATE 5 MG PO TABS
5.0000 mg | ORAL_TABLET | Freq: Every day | ORAL | Status: DC
  Administered 2022-09-19: 5 mg via ORAL
  Filled 2022-09-19: qty 1

## 2022-09-19 MED ORDER — PANTOPRAZOLE SODIUM 40 MG IV SOLR
40.0000 mg | Freq: Once | INTRAVENOUS | Status: AC
  Administered 2022-09-19: 40 mg via INTRAVENOUS
  Filled 2022-09-19: qty 10

## 2022-09-19 MED ORDER — NICARDIPINE HCL IN NACL 20-0.86 MG/200ML-% IV SOLN
3.0000 mg/h | INTRAVENOUS | Status: DC
  Administered 2022-09-19: 3 mg/h via INTRAVENOUS
  Administered 2022-09-19: 10 mg/h via INTRAVENOUS
  Administered 2022-09-19: 12.5 mg/h via INTRAVENOUS
  Administered 2022-09-20: 5 mg/h via INTRAVENOUS
  Administered 2022-09-20: 12 mg/h via INTRAVENOUS
  Administered 2022-09-20: 15 mg/h via INTRAVENOUS
  Administered 2022-09-20: 10 mg/h via INTRAVENOUS
  Administered 2022-09-20: 7.5 mg/h via INTRAVENOUS
  Administered 2022-09-20: 12 mg/h via INTRAVENOUS
  Administered 2022-09-20 (×2): 10 mg/h via INTRAVENOUS
  Administered 2022-09-20: 15 mg/h via INTRAVENOUS
  Administered 2022-09-20: 12 mg/h via INTRAVENOUS
  Administered 2022-09-20: 5 mg/h via INTRAVENOUS
  Administered 2022-09-20: 15 mg/h via INTRAVENOUS
  Administered 2022-09-21: 4 mg/h via INTRAVENOUS
  Administered 2022-09-21: 13 mg/h via INTRAVENOUS
  Administered 2022-09-21: 7.5 mg/h via INTRAVENOUS
  Administered 2022-09-21: 11 mg/h via INTRAVENOUS
  Administered 2022-09-21: 5 mg/h via INTRAVENOUS
  Administered 2022-09-21: 8 mg/h via INTRAVENOUS
  Administered 2022-09-22: 3 mg/h via INTRAVENOUS
  Administered 2022-09-22: 5 mg/h via INTRAVENOUS
  Filled 2022-09-19 (×3): qty 200
  Filled 2022-09-19: qty 400
  Filled 2022-09-19 (×2): qty 200
  Filled 2022-09-19: qty 400
  Filled 2022-09-19 (×11): qty 200
  Filled 2022-09-19: qty 400
  Filled 2022-09-19 (×3): qty 200

## 2022-09-19 MED ORDER — FOLIC ACID 1 MG PO TABS
1.0000 mg | ORAL_TABLET | Freq: Every day | ORAL | Status: DC
  Administered 2022-09-19 – 2022-09-24 (×6): 1 mg via ORAL
  Filled 2022-09-19 (×6): qty 1

## 2022-09-19 MED ORDER — LABETALOL HCL 5 MG/ML IV SOLN: 5.0000 mg | INTRAVENOUS | Status: DC | PRN

## 2022-09-19 MED ORDER — ORAL CARE MOUTH RINSE: 15.0000 mL | OROMUCOSAL | Status: DC | PRN

## 2022-09-19 MED ORDER — HYDROMORPHONE HCL 1 MG/ML IJ SOLN: 0.5000 mg | INTRAMUSCULAR | Status: DC | PRN

## 2022-09-19 MED ORDER — POLYETHYLENE GLYCOL 3350 17 G PO PACK: 17.0000 g | PACK | Freq: Every day | ORAL | Status: DC | PRN

## 2022-09-19 MED ORDER — LORAZEPAM 2 MG/ML IJ SOLN
1.0000 mg | INTRAMUSCULAR | Status: AC | PRN
  Administered 2022-09-20 (×2): 1 mg via INTRAVENOUS
  Filled 2022-09-19 (×3): qty 1

## 2022-09-19 MED ORDER — LACTATED RINGERS IV SOLN: INTRAVENOUS | Status: DC

## 2022-09-19 MED ORDER — CARVEDILOL 3.125 MG PO TABS
3.1250 mg | ORAL_TABLET | Freq: Two times a day (BID) | ORAL | Status: DC
  Administered 2022-09-19 – 2022-09-20 (×3): 3.125 mg via ORAL
  Filled 2022-09-19 (×3): qty 1

## 2022-09-19 MED ORDER — HYDRALAZINE HCL 25 MG PO TABS
25.0000 mg | ORAL_TABLET | Freq: Three times a day (TID) | ORAL | Status: DC
  Administered 2022-09-19: 25 mg via ORAL
  Filled 2022-09-19: qty 1

## 2022-09-19 MED ORDER — PROCHLORPERAZINE EDISYLATE 10 MG/2ML IJ SOLN: 5.0000 mg | Freq: Four times a day (QID) | INTRAMUSCULAR | Status: DC | PRN

## 2022-09-19 MED ORDER — HYDROMORPHONE HCL 1 MG/ML IJ SOLN
1.0000 mg | Freq: Once | INTRAMUSCULAR | Status: AC
  Administered 2022-09-19: 1 mg via INTRAVENOUS
  Filled 2022-09-19: qty 1

## 2022-09-19 MED ORDER — OXYCODONE HCL 5 MG PO TABS: 5.0000 mg | ORAL_TABLET | Freq: Four times a day (QID) | ORAL | Status: DC | PRN

## 2022-09-19 MED ORDER — HYDRALAZINE HCL 50 MG PO TABS
50.0000 mg | ORAL_TABLET | Freq: Three times a day (TID) | ORAL | Status: DC
  Administered 2022-09-19 – 2022-09-21 (×5): 50 mg via ORAL
  Filled 2022-09-19 (×5): qty 1

## 2022-09-19 MED ORDER — HYDROMORPHONE HCL 1 MG/ML IJ SOLN: 1.0000 mg | INTRAMUSCULAR | Status: DC | PRN

## 2022-09-19 MED ORDER — ADULT MULTIVITAMIN W/MINERALS CH
1.0000 | ORAL_TABLET | Freq: Every day | ORAL | Status: DC
  Administered 2022-09-19 – 2022-09-24 (×6): 1 via ORAL
  Filled 2022-09-19 (×6): qty 1

## 2022-09-19 MED ORDER — LORAZEPAM 1 MG PO TABS
1.0000 mg | ORAL_TABLET | ORAL | Status: AC | PRN
  Administered 2022-09-20: 2 mg via ORAL
  Administered 2022-09-21 (×3): 1 mg via ORAL
  Administered 2022-09-22: 2 mg via ORAL
  Administered 2022-09-22: 1 mg via ORAL
  Filled 2022-09-19: qty 2
  Filled 2022-09-19 (×3): qty 1
  Filled 2022-09-19: qty 2
  Filled 2022-09-19: qty 1
  Filled 2022-09-19: qty 2

## 2022-09-19 MED ORDER — LIDOCAINE VISCOUS HCL 2 % MT SOLN
15.0000 mL | Freq: Once | OROMUCOSAL | Status: AC
  Administered 2022-09-19: 15 mL via ORAL
  Filled 2022-09-19: qty 15

## 2022-09-19 MED ORDER — ALUM & MAG HYDROXIDE-SIMETH 200-200-20 MG/5ML PO SUSP
30.0000 mL | Freq: Once | ORAL | Status: AC
  Administered 2022-09-19: 30 mL via ORAL
  Filled 2022-09-19: qty 30

## 2022-09-19 MED ORDER — DEXTROSE 10 % IV SOLN: INTRAVENOUS | Status: DC

## 2022-09-19 MED ORDER — LACTATED RINGERS IV BOLUS
1000.0000 mL | Freq: Once | INTRAVENOUS | Status: AC
  Administered 2022-09-19: 1000 mL via INTRAVENOUS

## 2022-09-19 MED ORDER — THIAMINE MONONITRATE 100 MG PO TABS
100.0000 mg | ORAL_TABLET | Freq: Every day | ORAL | Status: DC
  Administered 2022-09-19 – 2022-09-24 (×6): 100 mg via ORAL
  Filled 2022-09-19 (×6): qty 1

## 2022-09-19 MED ORDER — AMLODIPINE BESYLATE 2.5 MG PO TABS: 2.5000 mg | ORAL_TABLET | Freq: Every day | ORAL | Status: DC

## 2022-09-19 MED ORDER — LABETALOL HCL 5 MG/ML IV SOLN
5.0000 mg | INTRAVENOUS | Status: DC | PRN
  Administered 2022-09-19 – 2022-09-20 (×3): 5 mg via INTRAVENOUS
  Filled 2022-09-19 (×3): qty 4

## 2022-09-19 MED ORDER — MELATONIN 5 MG PO TABS: 5.0000 mg | ORAL_TABLET | Freq: Every evening | ORAL | Status: DC | PRN

## 2022-09-19 MED ORDER — CHLORHEXIDINE GLUCONATE CLOTH 2 % EX PADS
6.0000 | MEDICATED_PAD | Freq: Every day | CUTANEOUS | Status: DC
  Administered 2022-09-19 – 2022-09-23 (×6): 6 via TOPICAL

## 2022-09-19 MED ORDER — SODIUM CHLORIDE 0.9 % IV SOLN: INTRAVENOUS | Status: DC

## 2022-09-19 MED ORDER — INSULIN (MYXREDLIN) INFUSION FOR HYPERTRIGLYCERIDEMIA
0.2000 [IU]/kg/h | INTRAVENOUS | Status: DC
  Administered 2022-09-19 (×3): 0.1 [IU]/kg/h via INTRAVENOUS
  Administered 2022-09-20: 0.2 [IU]/kg/h via INTRAVENOUS
  Administered 2022-09-20: 0.1 [IU]/kg/h via INTRAVENOUS
  Administered 2022-09-20 – 2022-09-22 (×7): 0.2 [IU]/kg/h via INTRAVENOUS
  Filled 2022-09-19 (×12): qty 100

## 2022-09-19 MED ORDER — THIAMINE HCL 100 MG/ML IJ SOLN
100.0000 mg | Freq: Every day | INTRAMUSCULAR | Status: DC
  Filled 2022-09-19 (×2): qty 2

## 2022-09-19 NOTE — Progress Notes (Signed)
CSW received consult for outpatient substance use resources. CSW spoke with patient at bedside. CSW offered patient outpatient substance use treatment services resources. Patient accepted. All questions answered. No further questions reported at this time.

## 2022-09-19 NOTE — ED Notes (Signed)
[facility] taking over care of patient at this time. No acute distress noted.  VSS.

## 2022-09-19 NOTE — Plan of Care (Signed)

## 2022-09-19 NOTE — H&P (Signed)
[facility] [place]  ASSESSMENT / PLAN: 57 y.o. 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  It is not clear to me if this dissection is acute or not. I think his symptoms are from pancreatitis.   Recommend impulse control with HR < 80 and SBP < 120. If unable to do this in the stepdown unit, recommend transfer to the ICU.   For now will favor conservative management. Will reimage the aorta in 48 hours. Sooner if any new or worsening abdominal symptoms.  CHIEF COMPLAINT: abdominal pain  HISTORY OF PRESENT ILLNESS: Patrick Daleidenis a 57y.o. male admitted to the internal medicine service for treatment of acute pancreatitis.  The patient has a longstanding history of hypertriglyceridemia, with an measured to be high triglycerides today on laboratory evaluation.  He is also a heavy alcohol user, reporting 4 or more drinks per night. He has a known history of abdominal aortic aneurysm. He has no known history of dissection. He reports epigastric discomfort radiating to his back. He is nauseated. He has dyspepsia.   Past Medical History:  Diagnosis Date   Hypertension    Kidney stone    Nosebleed     Past Surgical History:  Procedure Laterality Date   HERNIA REPAIR      No family history on file.  Social History   Socioeconomic History   Marital status: Married    Spouse name: Not on file   Number of children: Not on file   Years of education: Not on file   Highest education level: Not on file  Occupational History   Not on file  Tobacco Use   Smoking status: Former   Smokeless tobacco: Never  Substance and Sexual Activity   Alcohol use: Yes    Comment: daily   Drug use: No   Sexual activity: Not on file  Other Topics Concern   Not on file  Social History Narrative   Not on file   Social Determinants of Health   Financial Resource  Strain: Not on file  Food Insecurity: Not on file  Transportation Needs: Not on file  Physical Activity: Not on file  Stress: Not on file  Social Connections: Not on file  Intimate Partner Violence: Not on file    No Known Allergies  Current Facility-Administered Medications  Medication Dose Route Frequency Provider Last Rate Last Admin   acetaminophen (TYLENOL) tablet 650 mg  650 mg Oral Q6H PRN HIrene PapN, DO       carvedilol (COREG) tablet 3.125 mg  3.125 mg Oral BID WC Hall, Carole N, DO   3.125 mg at 09/19/22 0856   dextrose 5 % solution   Intravenous Continuous HKayleen Memos DO 50 mL/hr at 09/19/22 0615 New Bag at [id number]  folic acid (FOLVITE) tablet 1 mg  1 mg Oral Daily HIrene PapN, DO   1 mg at 09/19/22 0902   HYDROmorphone (DILAUDID) injection 0.5 mg  0.5 mg Intravenous Q4H PRN HIrene PapN, DO       insulin (MYXREDLIN) 100 units/100 mL infusion for hypertriglyceridemia-induced pancreatitis  0.1 Units/kg/hr Intravenous Continuous HIrene PapN, DO 10.21 mL/hr at 09/19/22 0617 0.1 Units/kg/hr at 09/19/22 0617   labetalol (NORMODYNE) injection 5 mg  5 mg Intravenous Q2H PRN HIrene PapN, DO   5 mg at 09/19/22 0621   LORazepam (ATIVAN) tablet 1-4 mg  1-4 mg Oral Q1H PRN Kayleen Memos, DO       Or   LORazepam (ATIVAN) injection 1-4 mg  1-4 mg Intravenous Q1H PRN [place] Crane, Carole N, DO       melatonin tablet 5 mg  5 mg Oral QHS PRN Kayleen Memos, DO       multivitamin with minerals tablet 1 tablet  1 tablet Oral Daily Kayleen Memos, DO   1 tablet at 09/19/22 [id number]   oxyCODONE (Oxy IR/ROXICODONE) immediate release tablet 5 mg  5 mg Oral Q6H PRN Irene Pap N, DO       polyethylene glycol (MIRALAX / GLYCOLAX) packet 17 g  17 g Oral Daily PRN Irene Pap N, DO       prochlorperazine (COMPAZINE) injection 5 mg  5 mg Intravenous Q6H PRN Irene Pap N, DO       thiamine (VITAMIN B1) tablet 100 mg  100 mg Oral Daily Irene Pap N, DO   100 mg at 09/19/22 [id number]   Or    thiamine (VITAMIN B1) injection 100 mg  100 mg Intravenous Daily Kayleen Memos, DO        PHYSICAL EXAM Vitals:   09/19/22 0415 09/19/22 0430 09/19/22 0532 09/19/22 0752  BP: 132/79 126/82 137/89 (!) 148/85  Pulse: 62 65 65 63  Resp: (!) '23 15 16 18  '$ Temp:  97.6 F (36.4 C) 98.1 F (36.7 C) 97.8 F (36.6 C)  TempSrc:  Oral Oral Oral  SpO2: 93% 94% 96% 96%  Weight:       Well appearing. No distress.  No distress Regular rate and rhythm Unlabored breathing Obese abdomen. Nontender. 2+ DP pulses.  PERTINENT LABORATORY AND RADIOLOGIC DATA  Most recent CBC    Latest Ref Rng & Units 09/19/2022    5:51 AM 09/18/2022   11:47 PM 05/02/2018   12:52 PM  CBC  WBC 4.0 - 10.5 K/uL 10.4  12.5  8.8   Hemoglobin 13.0 - 17.0 g/dL 14.9  16.1  15.8   Hematocrit 39.0 - 52.0 % 39.8  44.3  44.9   Platelets 150 - 400 K/uL 170  212  161      Most recent CMP    Latest Ref Rng & Units 09/19/2022    5:51 AM 09/18/2022   11:47 PM 05/02/2018   12:52 PM  CMP  Glucose 70 - 99 mg/dL 318  358  119   BUN 6 - 20 mg/dL '16  19  20   '$ Creatinine 0.61 - 1.24 mg/dL 0.89  0.99  0.96   Sodium 135 - 145 mmol/L 136  135  139   Potassium 3.5 - 5.1 mmol/L 4.5  4.5  3.3   Chloride 98 - 111 mmol/L 102  97  100   CO2 22 - 32 mmol/L '18  27  28   '$ Calcium 8.9 - 10.3 mg/dL 8.4  9.5  9.3   Total Protein 6.5 - 8.1 g/dL 6.0  7.2    Total Bilirubin 0.3 - 1.2 mg/dL 1.2  1.1    Alkaline Phos 38 - 126 U/L 80  105    AST 15 - 41 U/L 26  24    ALT 0 - 44 U/L 16  25      Renal function CrCl cannot be calculated (Unknown ideal weight.).  No results found for: "HGBA1C"  LDL Cholesterol  Date Value Ref Range Status  09/18/2022 UNABLE TO CALCULATE IF TRIGLYCERIDE OVER 400 mg/dL 0 - 99  mg/dL Final    Comment:    Performed at [facility], [place] [address] [place], [place] [address]   Direct LDL  Date Value Ref Range Status  09/18/2022 <10 0 - 99 mg/dL Final    Comment:    Performed at [place], [place] [address], [place], [place] [address]     Vascular Imaging: CT angiogram personally reviewed in detail Acute pancreatitis Acute dissection of abdominal aorta from supraceliac aorta to infrarenal aorta. The celiac, SMA, and both renal arteries are perfused off the true lumen. There is an abdominal aortic aneurysm measuring 67m at greatest dimension. There are no complicated features or signs of rupture.   TYevonne Aline HStanford Breed MD FACS [facility] HospitalPhone Number: [phone]/05/2023 9:33 AM   Total time spent on preparing this encounter including chart review, data review, collecting history, examining the patient, coordinating care for this new patient, 60 minutes.  Portions of this report may have been transcribed using voice recognition software.  Every effort has been made to ensure accuracy; however, inadvertent computerized transcription errors may still be present.

## 2022-09-19 NOTE — ED Provider Notes (Signed)
[facility] [place] Provider Note   CSN: [id number] Arrival date & time: 09/18/22  2330     History  Chief Complaint  Patient presents with   Chest Pain    Patrick [name] is a 57 y.o. male.  57 year old male who presents ER today secondary to chest pain.  History of high blood pressure, hyperlipidemia, per glycemia.  The patient states that he had off-and-on pain in the epigastric area chest x-ray reviewed to over the last week mostly at night when he lays down sometimes when he walks.  Its sharp he thinks has been indigestion however tonight the soda bicarbonate did not work so he presents here.  States that mildly elevated after he burped a couple times but he feels distended feels nauseous and the pain is central radiate towards his back.  During interview he stated started radiate a little lower in his stomach.  No history of the same.  Does have history of high blood pressure.  Recent fevers or cough.  Took 324 of aspirin prior to arrival.  He sees a cardiologist for difficulty control hypertension.   Chest Pain      Home Medications Prior to Admission medications   Medication Sig Start Date End Date Taking? Authorizing Provider  amLODipine (NORVASC) 10 MG tablet Take 10 mg by mouth daily.    [provider]  HYDROcodone-acetaminophen (NORCO/VICODIN) 5-325 MG tablet Take 1 tablet by mouth every 6 (six) hours as needed for moderate pain.    [provider]  lisinopril (PRINIVIL,ZESTRIL) 20 MG tablet Take 20 mg by mouth daily.    [provider]  nebivolol (BYSTOLIC) 10 MG tablet Take 10 mg by mouth daily.    [provider]  phenazopyridine (PYRIDIUM) 100 MG tablet Take 100 mg by mouth 3 (three) times daily as needed for pain.    [provider]  tamsulosin (FLOMAX) 0.4 MG CAPS capsule Take 0.4 mg by mouth.    [provider]      Allergies    Patient has no known allergies.    Review of  Systems   Review of Systems  Cardiovascular:  Positive for chest pain.    Physical Exam Updated Vital Signs BP (!) 148/82   Pulse 65   Temp 97.9 F (36.6 C) (Oral)   Resp 18   Wt 102.1 kg   SpO2 96%   BMI 32.28 kg/m  Physical Exam Vitals and nursing note reviewed.  Constitutional:      Appearance: He is well-developed.  HENT:     Head: Normocephalic and atraumatic.  Cardiovascular:     Rate and Rhythm: Normal rate.  Pulmonary:     Effort: Pulmonary effort is normal. No respiratory distress.  Abdominal:     General: There is no distension.  Musculoskeletal:        General: Normal range of motion.     Cervical back: Normal range of motion.  Neurological:     Mental Status: He is alert.     ED Results / Procedures / Treatments   Labs (all labs ordered are listed, but only abnormal results are displayed) Labs Reviewed  CBC WITH DIFFERENTIAL/PLATELET - Abnormal; Notable for the following components:      Result Value   WBC 12.5 (*)    MCHC 36.3 (*)    Neutro Abs 7.8 (*)    Monocytes Absolute 1.1 (*)    Abs Immature Granulocytes 0.09 (*)    All other components  within normal limits  BASIC METABOLIC PANEL - Abnormal; Notable for the following components:   Chloride 97 (*)    Glucose, Bld 358 (*)    All other components within normal limits  HEPATIC FUNCTION PANEL - Abnormal; Notable for the following components:   Bilirubin, Direct 0.3 (*)    All other components within normal limits  LIPID PANEL - Abnormal; Notable for the following components:   Cholesterol 599 (*)    Triglycerides >5,000 (*)    All other components within normal limits  LIPASE, BLOOD  LDL CHOLESTEROL, DIRECT  TROPONIN I (HIGH SENSITIVITY)  TROPONIN I (HIGH SENSITIVITY)    EKG EKG Interpretation  Date/Time:  Sunday September 18 2022 23:40:24 EDT Ventricular Rate:  68 PR Interval:  157 QRS Duration: 156 QT Interval:  469 QTC Calculation: 499 R Axis:   -39 Text Interpretation: Sinus  rhythm Left bundle branch block similar to earlier in night. LBBB with associated repol changes. Confirmed by Merrily Pew [phone]) on 09/18/2022 11:45:05 PM  Radiology CT Angio Chest/Abd/Pel for Dissection W and/or Wo Contrast  Result Date: 09/19/2022 CLINICAL DATA:  Acute aortic syndrome (AAS) suspected. Pt arrives with c/o chest pain that started earlier tonight. Pt tried some OTC meds without relief. Pt denies SOB. Pt endorses nausea. Per pt, pain radiates to his back. EXAM: CT ANGIOGRAPHY CHEST, ABDOMEN AND PELVIS TECHNIQUE: Non-contrast CT of the chest was initially obtained. Multidetector CT imaging through the chest, abdomen and pelvis was performed using the standard protocol during bolus administration of intravenous contrast. Multiplanar reconstructed images and MIPs were obtained and reviewed to evaluate the vascular anatomy. RADIATION DOSE REDUCTION: This exam was performed according to the departmental dose-optimization program which includes automated exposure control, adjustment of the mA and/or kV according to patient size and/or use of iterative reconstruction technique. CONTRAST:  135m OMNIPAQUE IOHEXOL 350 MG/ML SOLN COMPARISON:  CT abdomen pelvis 08/17/2020 FINDINGS: CTA CHEST FINDINGS Cardiovascular: Preferential opacification of the thoracic aorta. No evidence of thoracic aortic aneurysm or dissection. Normal heart size. No significant pericardial effusion. No atherosclerotic plaque of the thoracic aorta. No coronary artery calcifications. The main pulmonary artery is normal in caliber. No pulmonary embolus. Mediastinum/Nodes: No enlarged mediastinal, hilar, or axillary lymph nodes. Thyroid gland, trachea, and esophagus demonstrate no significant findings. Lungs/Pleura: No focal consolidation. Right middle lobe pulmonary micronodule (7:30). Vague subpleural 5 mm ground-glass airspace opacity (7:22). No pulmonary mass. No pleural effusion. No pneumothorax. Musculoskeletal: No chest wall  abnormality. No suspicious lytic or blastic osseous lesions. No acute displaced fracture. Review of the MIP images confirms the above findings. CTA ABDOMEN AND PELVIS FINDINGS VASCULAR Aorta: Interval increase in size of a distal infrarenal abdominal aorta aneurysm measuring 4.5 x 4.7 cm (from 3.7 x 3.5 cm). Aortic hiatus dissection that extends down to the distal infrarenal abdominal aorta. The dissection terminates at the level of the known distal infrarenal abdominal aorta aneurysm. Interval development of aneurysmal dilatation of the suprarenal abdominal aorta at the level of the dissection measuring 3.4 x 2.7 cm. Interval development of aneurysmal dilatation of the infrarenal abdominal aorta the level of the dissection measuring up to 3.4 x 2.9 cm. Retrograde filling of the excluded lumen is noted. Dissection flap extends to the origin of the bilateral renal arteries and inferior mesenteric artery. Dissection flap possibly extends to the origin of the celiac and superior mesenteric arteries. Tortuosity of the distal infrarenal abdominal aorta. Celiac: Patent without evidence of aneurysm, dissection, vasculitis or significant stenosis. SMA: Patent without evidence  of aneurysm, dissection, vasculitis or significant stenosis. Renals: Both renal arteries are patent without evidence of aneurysm, dissection, vasculitis, fibromuscular dysplasia or significant stenosis. IMA: Patent without evidence of aneurysm, dissection, vasculitis or significant stenosis. Inflow: Tortuosity. Aneurysmal dilatation of the left common iliac artery measuring up to 1.9 cm. No stenosis, dissection, vasculitis. Veins: No obvious venous abnormality within the limitations of this arterial phase study. Review of the MIP images confirms the above findings. NON-VASCULAR Hepatobiliary: Vague hyperdense 1 cm enhancing lesion within the posterior right hepatic lobe likely a flash filling hemangioma (5:92). Calcified gallstone noted within the  gallbladder lumen. No gallbladder wall thickening or pericholecystic fluid. No biliary dilatation. Pancreas: No focal lesion. Diffuse hazy contour of the pancreas with associated pancreatic fat stranding. No main pancreatic ductal dilatation. Spleen: Normal in size without focal abnormality. Adrenals/Urinary Tract: No adrenal nodule bilaterally. Bilateral kidneys enhance symmetrically. Fluid density lesion within the right kidney measuring up to 2.7 cm-consistent with a simple renal cyst. Simple renal cysts, in the absence of clinically indicated signs/symptoms, require no independent follow-up. No hydronephrosis. No hydroureter. The urinary bladder is unremarkable. Stomach/Bowel: Stomach is within normal limits. No evidence of bowel wall thickening or dilatation. Appendix appears normal. Lymphatic: No lymphadenopathy. Reproductive: Prostate is unremarkable. Other: No intraperitoneal free fluid. No intraperitoneal free gas. No organized fluid collection. Musculoskeletal: No abdominal wall hernia or abnormality. No suspicious lytic or blastic osseous lesions. No acute displaced fracture. Review of the MIP images confirms the above findings. IMPRESSION: 1. Acute pancreatitis. No pseudocyst. Timing of contrast not ideal for evaluating for necrotizing pancreatitis. 2. Cholelithiasis with no acute cholecystitis. 3. 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 Interval development of aneurysmal dilatation of the suprarenal and infrarenal abdominal aorta at the level of dissection (3.4 cm). Retrograde filling of the false lumen is noted. Dissection flap likely extends to the origin of the celiac, superior mesenteric, inferior mesenteric, bilateral renal arteries. Recommend vascular consultation. 4. Interval increase in size of the distal infrarenal abdominal aorta aneurysm (4.7 cm). Recommend follow-up CT/MR every 6 months and  vascular consultation. This recommendation follows ACR consensus guidelines: White Paper of the ACR Incidental Findings Committee II on Vascular Findings. J Am Coll Radiol 2013; 10:789-794. 5. 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 6. No acute ischemic findings within the abdomen or pelvis. 7. No thoracic aorta abnormality. 8. No pulmonary embolus. 9. Vague subpleural 5 mm ground-glass airspace opacity. No follow-up recommended. This recommendation follows the consensus statement: Guidelines for Management of Incidental Pulmonary Nodules Detected on CT Images: From the Fleischner Society 2017; Radiology 2017; 284:228-243. These results were called by telephone at the time of interpretation on 09/19/2022 at 1:13 am to provider [facility] , who verbally acknowledged these results. Electronically Signed   By: Iven Finn M.D.   On: 09/19/2022 01:32   DG Chest Portable 1 View  Result Date: 09/19/2022 CLINICAL DATA:  Chest pain EXAM: PORTABLE CHEST 1 VIEW COMPARISON:  Radiograph 05/02/2018 FINDINGS: Stable cardiomegaly. No focal consolidation, pleural effusion, or pneumothorax. No displaced rib fractures. IMPRESSION: No active disease. Electronically Signed   By: Placido Sou M.D.   On: 09/19/2022 00:03    Procedures .Critical Care  Performed by: Merrily Pew, MD Authorized by: Merrily Pew, MD   Critical care provider statement:    Critical care time (minutes):  30   Critical care was necessary to treat or prevent imminent or life-threatening deterioration  of the following conditions:  Circulatory failure and endocrine crisis   Critical care was time spent personally by me on the following activities:  Development of treatment plan with patient or surrogate, discussions with consultants, evaluation of patient's response to treatment, examination of patient, ordering and review of laboratory studies, ordering and review of radiographic studies, ordering and performing  treatments and interventions, pulse oximetry, re-evaluation of patient's condition and review of old charts     Medications Ordered in ED Medications  lactated ringers infusion ( Intravenous New Bag/Given 09/19/22 0400)  HYDROmorphone (DILAUDID) injection 1 mg (1 mg Intravenous Given 09/19/22 0005)  alum & mag hydroxide-simeth (MAALOX/MYLANTA) 200-200-20 MG/5ML suspension 30 mL (30 mLs Oral Given 09/19/22 0003)    And  lidocaine (XYLOCAINE) 2 % viscous mouth solution 15 mL (15 mLs Oral Given 09/19/22 0004)  pantoprazole (PROTONIX) injection 40 mg (40 mg Intravenous Given 09/19/22 0007)  iohexol (OMNIPAQUE) 350 MG/ML injection 100 mL (100 mLs Intravenous Contrast Given 09/19/22 0057)  lactated ringers bolus 1,000 mL (0 mLs Intravenous Stopped 09/19/22 0259)  HYDROmorphone (DILAUDID) injection 1 mg (1 mg Intravenous Given 09/19/22 0301)    ED Course/ Medical Decision Making/ A&P                             Medical Decision Making Amount and/or Complexity of Data Reviewed Labs: ordered. Radiology: ordered.  Risk OTC drugs. Prescription drug management. Decision regarding hospitalization.  Chest x-ray reviewed and interpreted by myself and mediastinum does initially look like it was little bit widened however accounting for differences in a portable versus AP technique it may be the same as 2019. EKG shows a new left bundle branch block. Differential concerns for aortic dissection/aneurysm versus ACS versus pancreatitis or peptic ulcer disease.  Will initiate troponins and get a CT scan as well. Was told by lab that they cannot run his labs here because there is too much lipids in the blood so we will have to send to [facility]  Add on lipid panel lipase.  This also brings increases the possibility of pancreatitis. After discussion with the radiologist it does appear the patient has an acute abdominal dissection just below the diaphragm down to his known infrarenal aneurysm.  I discussed this  with Dr. Stanford Breed with vascular surgery and they will consult while he is in the hospital.  It is also noted that he does have pancreatitis on the CT scan.  Discussed with hospitalist about the need for possible insulin infusion however does not have any other canary signs of severe pancreatitis to warrant insulin infusion at this time.  Will continue with n.p.o., pain medicine and fluids.  Will admit to the hospital for further workup and management. Will also order a statin.   Final Clinical Impression(s) / ED Diagnoses Final diagnoses:  Acute pancreatitis, unspecified complication status, unspecified pancreatitis type  Hyperlipidemia, unspecified hyperlipidemia type    Rx / DC Orders ED Discharge Orders     None         Dustie Brittle, Corene Cornea, MD 09/19/22 [phone]

## 2022-09-19 NOTE — ED Notes (Signed)
Patient's cardiac rhythm went into a narrower rhythm for a few seconds. Writer printed strip and gave to MD. Writer assessed patient who was asymptomatic for event.

## 2022-09-19 NOTE — ED Notes (Signed)
Pt returned from CT °

## 2022-09-19 NOTE — Progress Notes (Signed)
Plan of Care Note for accepted transfer   Patient: Patrick [name] MRN: [id number]   DOA: 09/18/2022  Facility requesting transfer: [facility]   Requesting Provider: Dr. Dayna Barker   Reason for transfer: Abdominal aortic dissection, acute pancreatitis   Facility course: 57 yr old man with HTN and nephrolithiasis who presents with chest pain radiating to his back that started last night.   BP initially 171/102, other vitals normal. Labs notable for glucose 358, WBC 12,500, and troponin 8. EKG demonstrates LBBB (new from 2019). CTA reveals acute pancreatitis and age-indeterminate abdominal aortic dissection.   Dr. Stanford Breed of vascular surgery was consulted by ED physician and asked to be notified when pt arrives to [facility].   Pt was treated with IVF, analgesia, and IV PPI in ED.   BP improved with analgesia.   Lipase and triglyceride levels not yet resulted.   Plan of care: The patient is accepted for admission to Progressive unit, at [facility].   Author: Vianne Bulls, MD 09/19/2022  Check [email or web address] for on-call coverage.  Nursing staff, Please call [facility] number on Amion as soon as patient's arrival, so appropriate admitting provider can evaluate the pt.

## 2022-09-19 NOTE — ED Notes (Signed)
Pt to CT on monitor with this RN

## 2022-09-19 NOTE — Progress Notes (Signed)
Full consult note to follow. Imaging reviewed. Patient with pancreatitis on CT scan; elevated triglycerides. Scan shows known AAA. There is an abdominal aortic dissection from the celiac to the aneurysm. This may be chronic, but I cannot be sure.  Recommend HR < 80. BP < 120.  I will formally evaluate him later.   Yevonne Aline. Stanford Breed, MD [facility] Phone Number: [phone] 09/19/2022 8:02 AM

## 2022-09-19 NOTE — Consult Note (Signed)
NAME:  Patrick [name], MRN:  [id number], DOB:  May 27, 1966, LOS: 0 ADMISSION DATE:  09/18/2022, CONSULTATION DATE:  09/19/22 REFERRING MD:  Dr. Karleen Hampshire - [facility], CHIEF COMPLAINT:  Hypertension    History of Present Illness:  Patrick [name] is a 57 y.o. male with a PMH significant for HTN, kidney stones, hypertriglyceridemia, and alcohol use who presented to the ED 3/10 with reports intermittent epigastric/ LUQ pain for one week but developed of sudden onset severe chest pain that radiated to his back last evening. Workup on admission revealed acute pancreatitis with abdominal aortic dissection extending from aortic hiatus down to the distal infrarenal abdominal aorta. Patient was originally admitted per [facility] with vascular surgery consult. Vascular recommended medical management to include HR < 80 and SBP < 120.  Additionally placed on insulin/ D5W for hypertriglyceridemia.  Given the need for IV drip to maintain hemodynamic control PCCM consulted for further management and transfer to ICU.  Former smoker.  ETOH use of both beer and liquor around 4-5 drinks nightly.  Denies previous withdrawal symptoms or current symptoms.  No illicit drug use.  Reports mostly compliant with taking hypertension meds but will sometimes miss evening doses.    Pertinent  Medical History  HTN, kidney stones, hypertriglyceridemia, and alcohol use  Significant Hospital Events: Including procedures, antibiotic start and stop dates in addition to other pertinent events   3/10 Presented with sudden onset severe chest pain found to have acute pancreatitis and abdominal aortic aneurysm onset unknown  3/11 PCCM consulted for need BP control  Interim History / Subjective:  No current complaints. Mild intermittent soreness in LUQ with movement.  SBP remains 130's.   Objective   Blood pressure (!) 142/83, pulse 60, temperature 97.7 F (36.5 C), temperature source Oral, resp. rate 19, weight 102.1 kg, SpO2 97 %.        Intake/Output  Summary (Last 24 hours) at 09/19/2022 1720 Last data filed at 09/19/2022 0759 Gross per 24 hour  Intake 1000 ml  Output 925 ml  Net 75 ml   Filed Weights   09/18/22 2336  Weight: 102.1 kg   Examination: General:  Obese male sitting upright in bed in NAD HEENT: MM pink/moist Neuro: Aox4, MAE CV: rr, +2 distal pulses PULM:  non labored, CTA, RA GI: protuberant, soft, mild LUQ tenderness, +BS, voids Extremities: warm/dry, no LE edema  Skin: no rashes   Resolved Hospital Problem list     Assessment & Plan:  Type B aortic dissection  -Originating from supracellac aorta and terminates into the abdominal aortic aneurysm measuring 23m at greatest dimension   P: Primary management per Vascular  Goal HR < 80 SBP < 120 HR well controlled but SBP till above goal.  Transfer to ICU and start cardene gtt  Plans for repeat imaging in 48hrs per Vascular Change NS to LR for MIVF for now 767mhr Continuous telemetry  Pain controll as below with bowel regimen Continue home Norvasc '10mg'$ , Coreg 3.'125mg'$  BID, and hydralazine '50mg'$  q 8hrs SCDs for now for VTE ppx  Monitor renal function closely   Hypertriglyceridemia resulting in acute pancreatitis  -CTA ABD on admit with acute pancreatis, no pseudocyst.  Lipase 30 -Triglycerides > 5,000 on admit  P: Continue insulin drip, change D5W to D10 given risk of hyponatremia and Na 134 Trend triglycerides daily Encourage enteral hydration as well > taking clear liquids well  Ensure adequate pain control, PRN Morphine   Diabetes Type 2 P: A1c in process  Hold home metformin  Hourly CBGs while on insulin and D10 gtt for above.  Titrate D10 to avoid hypo/ hyperglycemia  ETOH abuse  -last drink 3/10 PM.  Denies prior withdrawal symptoms in past  P:  Continue CIWA with prn ativan Daily thiamine/ folic/ MVI Cessation counseling   Obesity P:  - pending wt in Epic but reports 30lb weight gain gradually after losing down to 195lb 3 years ago.  -  outpt weight loss counseling    Best Practice (right click and "Reselect all SmartList Selections" daily)   Diet/type: clear liquids DVT prophylaxis: SCD GI prophylaxis: N/A Lines: N/A Foley:  N/A Code Status:  full code Last date of multidisciplinary goals of care discussion [3/11]  Patient and wife at bedside updated on plan of care.  Labs   CBC: Recent Labs  Lab 09/18/22 2347 09/19/22 0551  WBC 12.5* 10.4  NEUTROABS 7.8* 6.9  HGB 16.1 14.9  HCT 44.3 39.8  MCV 81.9 82.9  PLT 212 [id number]    Basic Metabolic Panel: Recent Labs  Lab 09/18/22 2347 09/19/22 0551 09/19/22 1044  NA 135 136 134*  K 4.5 4.5 3.6  CL 97* 102 100  CO2 27 18* 22  GLUCOSE 358* 318* 211*  BUN '19 16 14  '$ CREATININE 0.99 0.89 0.62  CALCIUM 9.5 8.4* 8.2*  MG  --  2.0  --   PHOS  --  3.6  --    GFR: CrCl cannot be calculated (Unknown ideal weight.). Recent Labs  Lab 09/18/22 2347 09/19/22 0551 09/19/22 0740 09/19/22 0938  WBC 12.5* 10.4  --   --   LATICACIDVEN  --   --  2.4* 2.2*    Liver Function Tests: Recent Labs  Lab 09/18/22 2347 09/19/22 0551  AST 24 26  ALT 25 16  ALKPHOS 105 80  BILITOT 1.1 1.2  PROT 7.2 6.0*  ALBUMIN 4.5 3.6   Recent Labs  Lab 09/18/22 2347  LIPASE 30   No results for input(s): "AMMONIA" in the last 168 hours.  ABG No results found for: "PHART", "PCO2ART", "PO2ART", "HCO3", "TCO2", "ACIDBASEDEF", "O2SAT"   Coagulation Profile: No results for input(s): "INR", "PROTIME" in the last 168 hours.  Cardiac Enzymes: No results for input(s): "CKTOTAL", "CKMB", "CKMBINDEX", "TROPONINI" in the last 168 hours.  HbA1C: No results found for: "HGBA1C"  CBG: Recent Labs  Lab 09/19/22 0901 09/19/22 1114 09/19/22 1313 09/19/22 1440 09/19/22 1712  GLUCAP 265* 181* 129* 174* 107*    Review of Systems:   As per HPI otherwise negative.   Past Medical History:  He,  has a past medical history of Hypertension, Kidney stone, and Nosebleed.   Surgical  History:   Past Surgical History:  Procedure Laterality Date   HERNIA REPAIR       Social History:   reports that he has quit smoking. He has never used smokeless tobacco. He reports current alcohol use. He reports that he does not use drugs.   Family History:  His family history is not on file.   Allergies No Known Allergies   Home Medications  Prior to Admission medications   Medication Sig Start Date End Date Taking? Authorizing Provider  acetaminophen (TYLENOL) 500 MG tablet Take 500-1,000 mg by mouth every 6 (six) hours as needed for moderate pain.   Yes [provider]  amLODipine (NORVASC) 5 MG tablet Take 5 mg by mouth in the morning and at bedtime.   Yes [provider]  aspirin 81 MG chewable tablet Chew 325 mg  by mouth daily as needed (Chest Pain/Pain).   Yes [provider]  carvedilol (COREG) 12.5 MG tablet Take 12.5 mg by mouth 2 (two) times daily with a meal.   Yes [provider]  psyllium (METAMUCIL) 58.6 % packet Take 1 packet by mouth daily.   Yes [provider]  sildenafil (VIAGRA) 50 MG tablet Take 50 mg by mouth as needed for erectile dysfunction. 05/05/22  Yes [provider]  spironolactone (ALDACTONE) 25 MG tablet Take 25 mg by mouth daily.   Yes [provider]  telmisartan (MICARDIS) 40 MG tablet Take 40 mg by mouth daily.   Yes [provider]  gabapentin (NEURONTIN) 300 MG capsule Take 300 mg by mouth 3 (three) times daily. Patient not taking: Reported on 09/19/2022 07/12/22   [provider]     Critical care time: 69 mins     Kennieth Rad, MSN, AG-ACNP-BC [facility] 09/19/2022, 6:07 PM  See Amion for pager If no response to pager, please call PCCM consult pager After 7:00 pm call Elink

## 2022-09-19 NOTE — ED Notes (Signed)
[facility] called to transport to IP at [facility] 04:00am

## 2022-09-19 NOTE — Progress Notes (Signed)
Report given to ICU nurse.

## 2022-09-19 NOTE — H&P (Addendum)
History and Physical  Patrick [name] [id number] DOB: 1966/05/05 DOA: 09/18/2022  Referring physician: Accepte by Dr. Myna Hidalgo [facility], hospitalist service. PCP: [name], MD  Outpatient Specialists: Vascular surgery, cardiology, general surgery, orthopedic surgery. Patient coming from: Home through [facility] ED.  Chief Complaint: Chest pain radiated to his back, sudden onset last night.  HPI: Patrick [name] is a 57 y.o. male with medical history significant for hypertension, nephrolithiasis, hypertriglyceridemia, alcohol use disorder, who initially presented to [facility] ED from home with complaints of severe chest pain radiating to his back that suddenly occurred last night.  Prior to this, the patient suffered from intermittent epigastric pain for the past week.  Associated with progressive abdominal distention.  He thought his symptoms were related to indigestion.  He presented to the ED for further evaluation.  In the ED, CT angio chest abdomen pelvis for dissection with and without contrast revealed acute pancreatitis.  No pseudocyst.  Cholelithiasis with no acute cholecystitis.  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  Interval development of aneurysmal dilatation of the suprarenal and infrarenal abdominal aorta at the level of dissection, 3.4 cm.  Retrograde filling of the false lumen is noted.  Dissection flap likely extends the origin of the celiac, superior mesenteric, inferior mesenteric, bilateral renal arteries.  Vascular consultation is recommended.  Interval increase in size of the distal infrarenal abdominal aortic aneurysm 4.7 cm.  Recommend a follow-up CT/MRI every 6 months and vascular consultation.  Aneurysmal dilatation of the left common iliac artery.  No acute ischemic findings within the abdominal or pelvis.  No thoracic aorta abnormality.  No pulmonary embolus.  Vague subpleural 5 mm  groundglass airspace opacity.  EDP discussed the case with vascular surgery, Dr. Stanford Breed who will see in consultation.  The patient was admitted by [facility], hospitalist service.  Lab studies notable for lipase 30, triglycerides greater than 5000, cholesterol 599, and serum glucose greater than 350.  Seen and examined at bedside at [facility] progressive care unit.  Pain is currently controlled, on IV narcotic based analgesics.  ED Course: Tmax 98.1.  BP 137/89, pulse 65, respiratory rate 16, O2 saturation 96% on room air.  Lab studies remarkable for triglycerides greater than 5000, serum glucose 358.  Cholesterol 599.  WBC 12.5.  Review of Systems: Review of systems as noted in the HPI. All other systems reviewed and are negative.   Past Medical History:  Diagnosis Date   Hypertension    Kidney stone    Nosebleed    Past Surgical History:  Procedure Laterality Date   HERNIA REPAIR      Social History:  reports that he has quit smoking. He has never used smokeless tobacco. He reports current alcohol use. He reports that he does not use drugs.   No Known Allergies  Family history: Mother with history of hypertension.  Prior to Admission medications   Medication Sig Start Date End Date Taking? Authorizing Provider  amLODipine (NORVASC) 10 MG tablet Take 10 mg by mouth daily.    [provider]  HYDROcodone-acetaminophen (NORCO/VICODIN) 5-325 MG tablet Take 1 tablet by mouth every 6 (six) hours as needed for moderate pain.    [provider]  lisinopril (PRINIVIL,ZESTRIL) 20 MG tablet Take 20 mg by mouth daily.    [provider]  nebivolol (BYSTOLIC) 10 MG tablet Take 10 mg by mouth daily.    [provider]  phenazopyridine (PYRIDIUM) 100 MG  tablet Take 100 mg by mouth 3 (three) times daily as needed for pain.    [provider]  tamsulosin (FLOMAX) 0.4 MG CAPS capsule Take 0.4 mg by mouth.    [provider]    Physical  Exam: BP 126/82   Pulse 65   Temp 97.6 F (36.4 C) (Oral)   Resp 15   Wt 102.1 kg   SpO2 94%   BMI 32.28 kg/m   General: 57 y.o. year-old male well developed well nourished in no acute distress.  Alert and oriented x3. Cardiovascular: Regular rate and rhythm with no rubs or gallops.  No thyromegaly or JVD noted.  No lower extremity edema. 2/4 pulses in all 4 extremities. Respiratory: Clear to auscultation with no wheezes or rales. Good inspiratory effort. Abdomen: Distended, epigastric tenderness with palpation.  With normal bowel sounds x4 quadrants. Muskuloskeletal: No cyanosis, clubbing or edema noted bilaterally Neuro: CN II-XII intact, strength, sensation, reflexes Skin: No ulcerative lesions noted or rashes Psychiatry: Judgement and insight appear normal. Mood is appropriate for condition and setting          Labs on Admission:  Basic Metabolic Panel: Recent Labs  Lab 09/18/22 2347  NA 135  K 4.5  CL 97*  CO2 27  GLUCOSE 358*  BUN 19  CREATININE 0.99  CALCIUM 9.5   Liver Function Tests: Recent Labs  Lab 09/18/22 2347  AST 24  ALT 25  ALKPHOS 105  BILITOT 1.1  PROT 7.2  ALBUMIN 4.5   Recent Labs  Lab 09/18/22 2347  LIPASE 30   No results for input(s): "AMMONIA" in the last 168 hours. CBC: Recent Labs  Lab 09/18/22 2347  WBC 12.5*  NEUTROABS 7.8*  HGB 16.1  HCT 44.3  MCV 81.9  PLT 212   Cardiac Enzymes: No results for input(s): "CKTOTAL", "CKMB", "CKMBINDEX", "TROPONINI" in the last 168 hours.  BNP (last 3 results) No results for input(s): "BNP" in the last 8760 hours.  ProBNP (last 3 results) No results for input(s): "PROBNP" in the last 8760 hours.  CBG: No results for input(s): "GLUCAP" in the last 168 hours.  Radiological Exams on Admission: CT Angio Chest/Abd/Pel for Dissection W and/or Wo Contrast  Result Date: 09/19/2022 CLINICAL DATA:  Acute aortic syndrome (AAS) suspected. Pt arrives with c/o chest pain that started earlier  tonight. Pt tried some OTC meds without relief. Pt denies SOB. Pt endorses nausea. Per pt, pain radiates to his back. EXAM: CT ANGIOGRAPHY CHEST, ABDOMEN AND PELVIS TECHNIQUE: Non-contrast CT of the chest was initially obtained. Multidetector CT imaging through the chest, abdomen and pelvis was performed using the standard protocol during bolus administration of intravenous contrast. Multiplanar reconstructed images and MIPs were obtained and reviewed to evaluate the vascular anatomy. RADIATION DOSE REDUCTION: This exam was performed according to the departmental dose-optimization program which includes automated exposure control, adjustment of the mA and/or kV according to patient size and/or use of iterative reconstruction technique. CONTRAST:  180m OMNIPAQUE IOHEXOL 350 MG/ML SOLN COMPARISON:  CT abdomen pelvis 08/17/2020 FINDINGS: CTA CHEST FINDINGS Cardiovascular: Preferential opacification of the thoracic aorta. No evidence of thoracic aortic aneurysm or dissection. Normal heart size. No significant pericardial effusion. No atherosclerotic plaque of the thoracic aorta. No coronary artery calcifications. The main pulmonary artery is normal in caliber. No pulmonary embolus. Mediastinum/Nodes: No enlarged mediastinal, hilar, or axillary lymph nodes. Thyroid gland, trachea, and esophagus demonstrate no significant findings. Lungs/Pleura: No focal consolidation. Right middle lobe pulmonary micronodule (7:30). Vague subpleural 5  mm ground-glass airspace opacity (7:22). No pulmonary mass. No pleural effusion. No pneumothorax. Musculoskeletal: No chest wall abnormality. No suspicious lytic or blastic osseous lesions. No acute displaced fracture. Review of the MIP images confirms the above findings. CTA ABDOMEN AND PELVIS FINDINGS VASCULAR Aorta: Interval increase in size of a distal infrarenal abdominal aorta aneurysm measuring 4.5 x 4.7 cm (from 3.7 x 3.5 cm). Aortic hiatus dissection that extends down to the distal  infrarenal abdominal aorta. The dissection terminates at the level of the known distal infrarenal abdominal aorta aneurysm. Interval development of aneurysmal dilatation of the suprarenal abdominal aorta at the level of the dissection measuring 3.4 x 2.7 cm. Interval development of aneurysmal dilatation of the infrarenal abdominal aorta the level of the dissection measuring up to 3.4 x 2.9 cm. Retrograde filling of the excluded lumen is noted. Dissection flap extends to the origin of the bilateral renal arteries and inferior mesenteric artery. Dissection flap possibly extends to the origin of the celiac and superior mesenteric arteries. Tortuosity of the distal infrarenal abdominal aorta. Celiac: Patent without evidence of aneurysm, dissection, vasculitis or significant stenosis. SMA: Patent without evidence of aneurysm, dissection, vasculitis or significant stenosis. Renals: Both renal arteries are patent without evidence of aneurysm, dissection, vasculitis, fibromuscular dysplasia or significant stenosis. IMA: Patent without evidence of aneurysm, dissection, vasculitis or significant stenosis. Inflow: Tortuosity. Aneurysmal dilatation of the left common iliac artery measuring up to 1.9 cm. No stenosis, dissection, vasculitis. Veins: No obvious venous abnormality within the limitations of this arterial phase study. Review of the MIP images confirms the above findings. NON-VASCULAR Hepatobiliary: Vague hyperdense 1 cm enhancing lesion within the posterior right hepatic lobe likely a flash filling hemangioma (5:92). Calcified gallstone noted within the gallbladder lumen. No gallbladder wall thickening or pericholecystic fluid. No biliary dilatation. Pancreas: No focal lesion. Diffuse hazy contour of the pancreas with associated pancreatic fat stranding. No main pancreatic ductal dilatation. Spleen: Normal in size without focal abnormality. Adrenals/Urinary Tract: No adrenal nodule bilaterally. Bilateral kidneys  enhance symmetrically. Fluid density lesion within the right kidney measuring up to 2.7 cm-consistent with a simple renal cyst. Simple renal cysts, in the absence of clinically indicated signs/symptoms, require no independent follow-up. No hydronephrosis. No hydroureter. The urinary bladder is unremarkable. Stomach/Bowel: Stomach is within normal limits. No evidence of bowel wall thickening or dilatation. Appendix appears normal. Lymphatic: No lymphadenopathy. Reproductive: Prostate is unremarkable. Other: No intraperitoneal free fluid. No intraperitoneal free gas. No organized fluid collection. Musculoskeletal: No abdominal wall hernia or abnormality. No suspicious lytic or blastic osseous lesions. No acute displaced fracture. Review of the MIP images confirms the above findings. IMPRESSION: 1. Acute pancreatitis. No pseudocyst. Timing of contrast not ideal for evaluating for necrotizing pancreatitis. 2. Cholelithiasis with no acute cholecystitis. 3. 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 Interval development of aneurysmal dilatation of the suprarenal and infrarenal abdominal aorta at the level of dissection (3.4 cm). Retrograde filling of the false lumen is noted. Dissection flap likely extends to the origin of the celiac, superior mesenteric, inferior mesenteric, bilateral renal arteries. Recommend vascular consultation. 4. Interval increase in size of the distal infrarenal abdominal aorta aneurysm (4.7 cm). Recommend follow-up CT/MR every 6 months and vascular consultation. This recommendation follows ACR consensus guidelines: White Paper of the ACR Incidental Findings Committee II on Vascular Findings. J Am Coll Radiol 2013; 10:789-794. 5. 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 6. No acute ischemic  findings within the abdomen or pelvis. 7. No thoracic aorta abnormality. 8. No pulmonary  embolus. 9. Vague subpleural 5 mm ground-glass airspace opacity. No follow-up recommended. This recommendation follows the consensus statement: Guidelines for Management of Incidental Pulmonary Nodules Detected on CT Images: From the Fleischner Society 2017; Radiology 2017; 284:228-243. These results were called by telephone at the time of interpretation on 09/19/2022 at 1:13 am to provider [facility] , who verbally acknowledged these results. Electronically Signed   By: Iven Finn M.D.   On: 09/19/2022 01:32   DG Chest Portable 1 View  Result Date: 09/19/2022 CLINICAL DATA:  Chest pain EXAM: PORTABLE CHEST 1 VIEW COMPARISON:  Radiograph 05/02/2018 FINDINGS: Stable cardiomegaly. No focal consolidation, pleural effusion, or pneumothorax. No displaced rib fractures. IMPRESSION: No active disease. Electronically Signed   By: Placido Sou M.D.   On: 09/19/2022 00:03    EKG: I independently viewed the EKG done and my findings are as followed: Sinus rhythm rate of 70.  Nonspecific ST-T changes.  QTc 498.  Assessment/Plan Present on Admission:  Dissecting AAA (abdominal aortic aneurysm) ([place])  Principal Problem:   Dissecting AAA (abdominal aortic aneurysm) ([place])  Dissecting AAA, POA Findings as stated above in HPI. Vascular surgery consulted, Dr. Stanford Breed. Goal SBP less than 120, goal DBP less than 80, heart rate less than 60, if not controlled may consider esmolol drip or nitro drip in the ICU. Pain control in place as needed Closely monitor vital signs in stepdown unit N.p.o. until seen by vascular surgery.   No pharmacological DVT prophylaxis until seen by vascular surgery. Rest of management per vascular surgery.  Hypertriglyceridemia induced acute pancreatitis, seen on CT scan CT findings suggestive of acute pancreatitis. Epigastric pain. Triglycerides greater than 5000 Lipase 30 Started insulin drip and D5W for hypertriglyceridemia  Cholelithiasis without evidence of acute  cholecystitis No biliary dilatation, no evidence for choledocholithiasis Monitor for now  Type 2 diabetes with hyperglycemia Obtain hemoglobin A1c Hold off home oral hypoglycemic, metformin. On insulin drip and D5W for hypertriglyceridemia  QTc prolongation QTc on admission twelve-lead EKG 498. Avoid QTc prolonging agents Optimize magnesium and potassium levels Closely monitor on telemetry  Hypertension, BP is not at goal, elevated IV labetalol as needed with parameters Goal BP less than 120/80 Closely monitor vital signs  Leukocytosis, suspect reactive in the setting of aortic dissection Presented with WBC of 12.5 K Afebrile.  Nonseptic appearing. Obtain lactic acid, monitor WBC Repeat CBC in the morning  Alcohol abuse with concern for alcohol withdrawal CIWA protocol in place Multivitamin, thiamine, and folic acid supplement TOC to provide resources for alcohol cessation.    Critical care time: 65 minutes.    DVT prophylaxis: SCDs, defer pharmacological DVT prophylaxis to vascular surgery.  Code Status: Full code, stated by the patient himself.  Family Communication: None at bedside.  Disposition Plan: Admitted to progressive care unit by Dr. Myna Hidalgo [facility], hospitalist service.  Consults called: Vascular surgery.  Admission status: Inpatient status.   Status is: Inpatient The patient requires at least 2 midnights for further evaluation and treatment of present condition.   Kayleen Memos MD [facility] Pager [phone]  If 7PM-7AM, please contact night-coverage [email or web address] Password [facility]  09/19/2022, 5:26 AM

## 2022-09-19 NOTE — ED Notes (Signed)
Writer attempted to call report to unit. Writer informed that Agricultural consultant is reading through chart at this time and will return call shortly.

## 2022-09-19 NOTE — Care Management (Signed)
  Transition of Care [facility]) Screening Note   Patient Details  Name: Patrick [name] Date of Birth: Aug 20, 1965   Transition of Care [facility]) CM/SW Contact:    Bethena Roys, RN Phone Number: 09/19/2022, 2:51 PM    Transition of Care Department [facility]) has reviewed the patient and no TOC needs have been identified at this time. We will continue to monitor patient advancement through interdisciplinary progression rounds. If new patient transition needs arise, please place a TOC consult.

## 2022-09-19 NOTE — Progress Notes (Signed)
[facility]                                                                               Patrick [name], is a 57 y.o. male, DOB - 12-Jun-1966, [id number] Admit date - 09/18/2022    Outpatient Primary MD for the patient is [name], MD  LOS - 0  days    Brief summary   Patrick [name] is a 57 y.o. male with medical history significant for hypertension, nephrolithiasis, hypertriglyceridemia, alcohol use disorder, who initially presented to [facility] ED from home with complaints of severe chest pain radiating to his back that suddenly occurred last night.     CT angio chest abdomen pelvis for dissection with and without contrast revealed acute pancreatitis.  No pseudocyst.  Cholelithiasis with no acute cholecystitis.  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  Interval development of aneurysmal dilatation of the suprarenal and infrarenal abdominal aorta at the level of dissection, 3.4 cm.  Retrograde filling of the false lumen is noted.  Dissection flap likely extends the origin of the celiac, superior mesenteric, inferior mesenteric, bilateral renal arteries.    Vascular surgery consulted and recommendations given.   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  Vascular surgery consulted.  Dr. Stanford Breed. Goal SBP less than 120, goal DBP less than 80, heart rate less than 60, if not controlled may consider esmolol drip or nitro drip in the ICU. BP not well controlled with hydralazine, Norvasc  and coreg.  Requested pCCM consult for esmolol gtt and requested transfer to ICU.    Acute pancreatitis secondary to hypertriglyceridemia On insulin gtt. Dextrose gtt if cbg<200 Check triglyceride levels every 12 hours.  Abd pain better controlled. :       Estimated body mass index is 32.28 kg/m as calculated from the following:   Height as of  05/02/18: '5\' 10"'$  (1.778 m).   Weight as of this encounter: 102.1 kg.  Code Status: full code.  DVT Prophylaxis:  SCDs Start: 09/19/22 0524   Level of Care: Level of care: ICU Family Communication: none at bedside.     Antimicrobials:   Anti-infectives (From admission, onward)    None        Medications  Scheduled Meds:  amLODipine  5 mg Oral Daily   carvedilol  3.125 mg Oral BID WC   folic acid  1 mg Oral Daily   hydrALAZINE  50 mg Oral Q8H   multivitamin with minerals  1 tablet Oral Daily   thiamine  100 mg Oral Daily   Or   thiamine  100 mg Intravenous Daily   Continuous Infusions:  sodium chloride Stopped (09/19/22 1352)   dextrose     insulin 0.1 Units/kg/hr (09/19/22 1401)   niCARDipine     PRN Meds:.acetaminophen, HYDROmorphone (DILAUDID) injection, labetalol, LORazepam **OR** LORazepam, melatonin, oxyCODONE, polyethylene glycol, prochlorperazine    Subjective:   Patrick [name] was seen and examined today.  Abd pain minimal.   Objective:   Vitals:   09/19/22 1440 09/19/22 1510 09/19/22 1540 09/19/22 1640  BP: Marland Kitchen)  152/91 132/84 (!) 147/86 (!) 142/83  Pulse: 65 62 62 60  Resp:    19  Temp:    97.7 F (36.5 C)  TempSrc:    Oral  SpO2:    97%  Weight:        Intake/Output Summary (Last 24 hours) at 09/19/2022 1752 Last data filed at 09/19/2022 0759 Gross per 24 hour  Intake 1000 ml  Output 925 ml  Net 75 ml   Filed Weights   09/18/22 2336  Weight: 102.1 kg     Exam General exam: Appears calm and comfortable  Respiratory system: Clear to auscultation. Respiratory effort normal. Cardiovascular system: S1 & S2 heard, RRR. No JVD,  Gastrointestinal system: Abdomen is soft, distended,tender.  Central nervous system: Alert and oriented. No focal neurological deficits. Extremities: Symmetric 5 x 5 power. Skin: No rashes, Psychiatry: Mood & affect appropriate.    Data Reviewed:  I have personally reviewed following labs and imaging  studies   CBC Lab Results  Component Value Date   WBC 10.4 09/19/2022   RBC 4.80 09/19/2022   HGB 14.9 09/19/2022   HCT 39.8 09/19/2022   MCV 82.9 09/19/2022   MCH 31.0 09/19/2022   PLT 170 09/19/2022   MCHC 37.4 (H) 09/19/2022   RDW 12.4 09/19/2022   LYMPHSABS 2.4 09/19/2022   MONOABS 0.7 09/19/2022   EOSABS 0.3 09/19/2022   BASOSABS 0.0 0000000     Last metabolic panel Lab Results  Component Value Date   NA 134 (L) 09/19/2022   K 3.6 09/19/2022   CL 100 09/19/2022   CO2 22 09/19/2022   BUN 14 09/19/2022   CREATININE 0.62 09/19/2022   GLUCOSE 211 (H) 09/19/2022   GFRNONAA >60 09/19/2022   GFRAA >60 05/02/2018   CALCIUM 8.2 (L) 09/19/2022   PHOS 3.6 09/19/2022   PROT 6.0 (L) 09/19/2022   ALBUMIN 3.6 09/19/2022   BILITOT 1.2 09/19/2022   ALKPHOS 80 09/19/2022   AST 26 09/19/2022   ALT 16 09/19/2022   ANIONGAP 12 09/19/2022    CBG (last 3)  Recent Labs    09/19/22 1313 09/19/22 1440 09/19/22 1712  GLUCAP 129* 174* 107*      Coagulation Profile: No results for input(s): "INR", "PROTIME" in the last 168 hours.   Radiology Studies: CT Angio Chest/Abd/Pel for Dissection W and/or Wo Contrast  Result Date: 09/19/2022 CLINICAL DATA:  Acute aortic syndrome (AAS) suspected. Pt arrives with c/o chest pain that started earlier tonight. Pt tried some OTC meds without relief. Pt denies SOB. Pt endorses nausea. Per pt, pain radiates to his back. EXAM: CT ANGIOGRAPHY CHEST, ABDOMEN AND PELVIS TECHNIQUE: Non-contrast CT of the chest was initially obtained. Multidetector CT imaging through the chest, abdomen and pelvis was performed using the standard protocol during bolus administration of intravenous contrast. Multiplanar reconstructed images and MIPs were obtained and reviewed to evaluate the vascular anatomy. RADIATION DOSE REDUCTION: This exam was performed according to the departmental dose-optimization program which includes automated exposure control, adjustment  of the mA and/or kV according to patient size and/or use of iterative reconstruction technique. CONTRAST:  186m OMNIPAQUE IOHEXOL 350 MG/ML SOLN COMPARISON:  CT abdomen pelvis 08/17/2020 FINDINGS: CTA CHEST FINDINGS Cardiovascular: Preferential opacification of the thoracic aorta. No evidence of thoracic aortic aneurysm or dissection. Normal heart size. No significant pericardial effusion. No atherosclerotic plaque of the thoracic aorta. No coronary artery calcifications. The main pulmonary artery is normal in caliber. No pulmonary embolus. Mediastinum/Nodes: No enlarged mediastinal, hilar, or axillary  lymph nodes. Thyroid gland, trachea, and esophagus demonstrate no significant findings. Lungs/Pleura: No focal consolidation. Right middle lobe pulmonary micronodule (7:30). Vague subpleural 5 mm ground-glass airspace opacity (7:22). No pulmonary mass. No pleural effusion. No pneumothorax. Musculoskeletal: No chest wall abnormality. No suspicious lytic or blastic osseous lesions. No acute displaced fracture. Review of the MIP images confirms the above findings. CTA ABDOMEN AND PELVIS FINDINGS VASCULAR Aorta: Interval increase in size of a distal infrarenal abdominal aorta aneurysm measuring 4.5 x 4.7 cm (from 3.7 x 3.5 cm). Aortic hiatus dissection that extends down to the distal infrarenal abdominal aorta. The dissection terminates at the level of the known distal infrarenal abdominal aorta aneurysm. Interval development of aneurysmal dilatation of the suprarenal abdominal aorta at the level of the dissection measuring 3.4 x 2.7 cm. Interval development of aneurysmal dilatation of the infrarenal abdominal aorta the level of the dissection measuring up to 3.4 x 2.9 cm. Retrograde filling of the excluded lumen is noted. Dissection flap extends to the origin of the bilateral renal arteries and inferior mesenteric artery. Dissection flap possibly extends to the origin of the celiac and superior mesenteric arteries.  Tortuosity of the distal infrarenal abdominal aorta. Celiac: Patent without evidence of aneurysm, dissection, vasculitis or significant stenosis. SMA: Patent without evidence of aneurysm, dissection, vasculitis or significant stenosis. Renals: Both renal arteries are patent without evidence of aneurysm, dissection, vasculitis, fibromuscular dysplasia or significant stenosis. IMA: Patent without evidence of aneurysm, dissection, vasculitis or significant stenosis. Inflow: Tortuosity. Aneurysmal dilatation of the left common iliac artery measuring up to 1.9 cm. No stenosis, dissection, vasculitis. Veins: No obvious venous abnormality within the limitations of this arterial phase study. Review of the MIP images confirms the above findings. NON-VASCULAR Hepatobiliary: Vague hyperdense 1 cm enhancing lesion within the posterior right hepatic lobe likely a flash filling hemangioma (5:92). Calcified gallstone noted within the gallbladder lumen. No gallbladder wall thickening or pericholecystic fluid. No biliary dilatation. Pancreas: No focal lesion. Diffuse hazy contour of the pancreas with associated pancreatic fat stranding. No main pancreatic ductal dilatation. Spleen: Normal in size without focal abnormality. Adrenals/Urinary Tract: No adrenal nodule bilaterally. Bilateral kidneys enhance symmetrically. Fluid density lesion within the right kidney measuring up to 2.7 cm-consistent with a simple renal cyst. Simple renal cysts, in the absence of clinically indicated signs/symptoms, require no independent follow-up. No hydronephrosis. No hydroureter. The urinary bladder is unremarkable. Stomach/Bowel: Stomach is within normal limits. No evidence of bowel wall thickening or dilatation. Appendix appears normal. Lymphatic: No lymphadenopathy. Reproductive: Prostate is unremarkable. Other: No intraperitoneal free fluid. No intraperitoneal free gas. No organized fluid collection. Musculoskeletal: No abdominal wall hernia or  abnormality. No suspicious lytic or blastic osseous lesions. No acute displaced fracture. Review of the MIP images confirms the above findings. IMPRESSION: 1. Acute pancreatitis. No pseudocyst. Timing of contrast not ideal for evaluating for necrotizing pancreatitis. 2. Cholelithiasis with no acute cholecystitis. 3. 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 Interval development of aneurysmal dilatation of the suprarenal and infrarenal abdominal aorta at the level of dissection (3.4 cm). Retrograde filling of the false lumen is noted. Dissection flap likely extends to the origin of the celiac, superior mesenteric, inferior mesenteric, bilateral renal arteries. Recommend vascular consultation. 4. Interval increase in size of the distal infrarenal abdominal aorta aneurysm (4.7 cm). Recommend follow-up CT/MR every 6 months and vascular consultation. This recommendation follows ACR consensus guidelines: White Paper of the ACR Incidental Findings Committee II  on Vascular Findings. J Am Coll Radiol 2013; 10:789-794. 5. 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 6. No acute ischemic findings within the abdomen or pelvis. 7. No thoracic aorta abnormality. 8. No pulmonary embolus. 9. Vague subpleural 5 mm ground-glass airspace opacity. No follow-up recommended. This recommendation follows the consensus statement: Guidelines for Management of Incidental Pulmonary Nodules Detected on CT Images: From the Fleischner Society 2017; Radiology 2017; 284:228-243. These results were called by telephone at the time of interpretation on 09/19/2022 at 1:13 am to provider [facility] , who verbally acknowledged these results. Electronically Signed   By: Iven Finn M.D.   On: 09/19/2022 01:32   DG Chest Portable 1 View  Result Date: 09/19/2022 CLINICAL DATA:  Chest pain EXAM: PORTABLE CHEST 1 VIEW COMPARISON:   Radiograph 05/02/2018 FINDINGS: Stable cardiomegaly. No focal consolidation, pleural effusion, or pneumothorax. No displaced rib fractures. IMPRESSION: No active disease. Electronically Signed   By: Placido Sou M.D.   On: 09/19/2022 00:03       Hosie Poisson M.D. [facility] 09/19/2022, 5:52 PM  Available via Epic secure chat 7am-7pm After 7 pm, please refer to night coverage provider listed on amion.

## 2022-09-20 ENCOUNTER — Other Ambulatory Visit (HOSPITAL_COMMUNITY): Payer: Self-pay

## 2022-09-20 DIAGNOSIS — I7102 Dissection of abdominal aorta: Secondary | ICD-10-CM | POA: Diagnosis not present

## 2022-09-20 DIAGNOSIS — E785 Hyperlipidemia, unspecified: Secondary | ICD-10-CM | POA: Diagnosis not present

## 2022-09-20 DIAGNOSIS — K859 Acute pancreatitis without necrosis or infection, unspecified: Secondary | ICD-10-CM | POA: Diagnosis not present

## 2022-09-20 LAB — GLUCOSE, CAPILLARY
Glucose-Capillary: 111 mg/dL — ABNORMAL HIGH (ref 70–99)
Glucose-Capillary: 81 mg/dL (ref 70–99)
Glucose-Capillary: 94 mg/dL (ref 70–99)
Glucose-Capillary: 142 mg/dL — ABNORMAL HIGH (ref 70–99)
Glucose-Capillary: 76 mg/dL (ref 70–99)
Glucose-Capillary: 99 mg/dL (ref 70–99)
Glucose-Capillary: 85 mg/dL (ref 70–99)
Glucose-Capillary: 93 mg/dL (ref 70–99)
Glucose-Capillary: 174 mg/dL — ABNORMAL HIGH (ref 70–99)
Glucose-Capillary: 216 mg/dL — ABNORMAL HIGH (ref 70–99)
Glucose-Capillary: 122 mg/dL — ABNORMAL HIGH (ref 70–99)
Glucose-Capillary: 129 mg/dL — ABNORMAL HIGH (ref 70–99)
Glucose-Capillary: 153 mg/dL — ABNORMAL HIGH (ref 70–99)
Glucose-Capillary: 78 mg/dL (ref 70–99)
Glucose-Capillary: 106 mg/dL — ABNORMAL HIGH (ref 70–99)
Glucose-Capillary: 60 mg/dL — ABNORMAL LOW (ref 70–99)
Glucose-Capillary: 66 mg/dL — ABNORMAL LOW (ref 70–99)
Glucose-Capillary: 134 mg/dL — ABNORMAL HIGH (ref 70–99)
Glucose-Capillary: 88 mg/dL (ref 70–99)
Glucose-Capillary: 161 mg/dL — ABNORMAL HIGH (ref 70–99)
Glucose-Capillary: 139 mg/dL — ABNORMAL HIGH (ref 70–99)

## 2022-09-20 LAB — COMPREHENSIVE METABOLIC PANEL
ALT: 15 U/L (ref 0–44)
AST: 12 U/L — ABNORMAL LOW (ref 15–41)
Albumin: 3 g/dL — ABNORMAL LOW (ref 3.5–5.0)
Alkaline Phosphatase: 63 U/L (ref 38–126)
Anion gap: 11 (ref 5–15)
BUN: 8 mg/dL (ref 6–20)
CO2: 22 mmol/L (ref 22–32)
Calcium: 7.9 mg/dL — ABNORMAL LOW (ref 8.9–10.3)
Chloride: 102 mmol/L (ref 98–111)
Creatinine, Ser: 0.65 mg/dL (ref 0.61–1.24)
GFR, Estimated: >60 mL/min (ref >60)
Glucose, Bld: 94 mg/dL (ref 70–99)
Potassium: 2.5 mmol/L — CL (ref 3.5–5.1)
Sodium: 135 mmol/L (ref 135–145)
Total Bilirubin: 0.3 mg/dL (ref 0.3–1.2)
Total Protein: 5.3 g/dL — ABNORMAL LOW (ref 6.5–8.1)

## 2022-09-20 LAB — BASIC METABOLIC PANEL
Anion gap: 11 (ref 5–15)
Anion gap: 10 (ref 5–15)
BUN: 7 mg/dL (ref 6–20)
BUN: <5 mg/dL — ABNORMAL LOW (ref 6–20)
CO2: 19 mmol/L — ABNORMAL LOW (ref 22–32)
CO2: 18 mmol/L — ABNORMAL LOW (ref 22–32)
Calcium: 8.3 mg/dL — ABNORMAL LOW (ref 8.9–10.3)
Calcium: 8.3 mg/dL — ABNORMAL LOW (ref 8.9–10.3)
Chloride: 105 mmol/L (ref 98–111)
Chloride: 106 mmol/L (ref 98–111)
Creatinine, Ser: 0.64 mg/dL (ref 0.61–1.24)
Creatinine, Ser: 0.68 mg/dL (ref 0.61–1.24)
GFR, Estimated: >60 mL/min (ref >60)
GFR, Estimated: >60 mL/min (ref >60)
Glucose, Bld: 89 mg/dL (ref 70–99)
Glucose, Bld: 117 mg/dL — ABNORMAL HIGH (ref 70–99)
Potassium: 3.7 mmol/L (ref 3.5–5.1)
Potassium: 4 mmol/L (ref 3.5–5.1)
Sodium: 135 mmol/L (ref 135–145)
Sodium: 134 mmol/L — ABNORMAL LOW (ref 135–145)

## 2022-09-20 LAB — CBC
HCT: 39.2 % (ref 39.0–52.0)
Hemoglobin: 14.3 g/dL (ref 13.0–17.0)
MCH: 30.3 pg (ref 26.0–34.0)
MCHC: 36.5 g/dL — ABNORMAL HIGH (ref 30.0–36.0)
MCV: 83.1 fL (ref 80.0–100.0)
Platelets: 153 10*3/uL (ref 150–400)
RBC: 4.72 MIL/uL (ref 4.22–5.81)
RDW: 12.4 % (ref 11.5–15.5)
WBC: 10.3 10*3/uL (ref 4.0–10.5)
nRBC: 0 % (ref 0.0–0.2)

## 2022-09-20 LAB — MAGNESIUM
Magnesium: 1.9 mg/dL (ref 1.7–2.4)
Magnesium: 2 mg/dL (ref 1.7–2.4)

## 2022-09-20 LAB — PHOSPHORUS: Phosphorus: 2.7 mg/dL (ref 2.5–4.6)

## 2022-09-20 LAB — TRIGLYCERIDES
Triglycerides: 1462 mg/dL — ABNORMAL HIGH (ref <150)
Triglycerides: 1467 mg/dL — ABNORMAL HIGH (ref <150)

## 2022-09-20 MED ORDER — LOSARTAN POTASSIUM 50 MG PO TABS
100.0000 mg | ORAL_TABLET | Freq: Every day | ORAL | Status: DC
  Administered 2022-09-20 – 2022-09-24 (×5): 100 mg via ORAL
  Filled 2022-09-20 (×5): qty 2

## 2022-09-20 MED ORDER — DEXTROSE 10 % IV SOLN: INTRAVENOUS | Status: AC

## 2022-09-20 MED ORDER — POTASSIUM CHLORIDE CRYS ER 20 MEQ PO TBCR
30.0000 meq | EXTENDED_RELEASE_TABLET | Freq: Once | ORAL | Status: AC
  Administered 2022-09-20: 30 meq via ORAL
  Filled 2022-09-20: qty 1

## 2022-09-20 MED ORDER — POTASSIUM CHLORIDE 10 MEQ/100ML IV SOLN
10.0000 meq | INTRAVENOUS | Status: AC
  Administered 2022-09-20 (×5): 10 meq via INTRAVENOUS
  Filled 2022-09-20 (×5): qty 100

## 2022-09-20 MED ORDER — FENOFIBRATE 160 MG PO TABS
160.0000 mg | ORAL_TABLET | Freq: Every day | ORAL | Status: DC
  Administered 2022-09-20 – 2022-09-24 (×5): 160 mg via ORAL
  Filled 2022-09-20 (×5): qty 1

## 2022-09-20 MED ORDER — MAGNESIUM SULFATE 2 GM/50ML IV SOLN
2.0000 g | Freq: Once | INTRAVENOUS | Status: AC
  Administered 2022-09-20: 2 g via INTRAVENOUS
  Filled 2022-09-20: qty 50

## 2022-09-20 MED ORDER — AMLODIPINE BESYLATE 10 MG PO TABS
10.0000 mg | ORAL_TABLET | Freq: Every day | ORAL | Status: DC
  Administered 2022-09-20 – 2022-09-24 (×5): 10 mg via ORAL
  Filled 2022-09-20 (×5): qty 1

## 2022-09-20 MED ORDER — POTASSIUM CHLORIDE 20 MEQ PO PACK
60.0000 meq | PACK | Freq: Once | ORAL | Status: AC
  Administered 2022-09-20: 60 meq via ORAL
  Filled 2022-09-20: qty 3

## 2022-09-20 MED ORDER — ATORVASTATIN CALCIUM 10 MG PO TABS
20.0000 mg | ORAL_TABLET | Freq: Every day | ORAL | Status: DC
  Administered 2022-09-20 – 2022-09-24 (×5): 20 mg via ORAL
  Filled 2022-09-20 (×6): qty 2

## 2022-09-20 MED ORDER — ALUM & MAG HYDROXIDE-SIMETH 200-200-20 MG/5ML PO SUSP
15.0000 mL | Freq: Four times a day (QID) | ORAL | Status: DC | PRN
  Administered 2022-09-21: 15 mL via ORAL
  Filled 2022-09-20 (×2): qty 30

## 2022-09-20 MED ORDER — CARVEDILOL 12.5 MG PO TABS
12.5000 mg | ORAL_TABLET | Freq: Two times a day (BID) | ORAL | Status: DC
  Administered 2022-09-20: 12.5 mg via ORAL
  Filled 2022-09-20 (×2): qty 1

## 2022-09-20 MED ORDER — POTASSIUM CHLORIDE CRYS ER 20 MEQ PO TBCR
40.0000 meq | EXTENDED_RELEASE_TABLET | Freq: Once | ORAL | Status: AC
  Administered 2022-09-20: 40 meq via ORAL
  Filled 2022-09-20: qty 2

## 2022-09-20 MED ORDER — ESMOLOL HCL-SODIUM CHLORIDE 2000 MG/100ML IV SOLN
25.0000 ug/kg/min | INTRAVENOUS | Status: DC
  Filled 2022-09-20: qty 100

## 2022-09-20 NOTE — Consult Note (Signed)
NAME:  Patrick [name], MRN:  [id number], DOB:  1965-12-05, LOS: 1 ADMISSION DATE:  09/18/2022, CONSULTATION DATE:  09/19/22 REFERRING MD:  Dr. Karleen Hampshire - [facility], CHIEF COMPLAINT:  Hypertension    History of Present Illness:  Patrick [name] is a 57 y.o. male with a PMH significant for HTN, kidney stones, hypertriglyceridemia, and alcohol use who presented to the ED 3/10 with reports intermittent epigastric/ LUQ pain for one week but developed of sudden onset severe chest pain that radiated to his back last evening. Workup on admission revealed acute pancreatitis with abdominal aortic dissection extending from aortic hiatus down to the distal infrarenal abdominal aorta. Patient was originally admitted per [facility] with vascular surgery consult. Vascular recommended medical management to include HR < 80 and SBP < 120.  Additionally placed on insulin/ D5W for hypertriglyceridemia.  Given the need for IV drip to maintain hemodynamic control PCCM consulted for further management and transfer to ICU.  Former smoker.  ETOH use of both beer and liquor around 4-5 drinks nightly.  Denies previous withdrawal symptoms or current symptoms.  No illicit drug use.  Reports mostly compliant with taking hypertension meds but will sometimes miss evening doses.    Pertinent  Medical History  HTN, kidney stones, hypertriglyceridemia, and alcohol use  Significant Hospital Events: Including procedures, antibiotic start and stop dates in addition to other pertinent events   3/10 Presented with sudden onset severe chest pain found to have acute pancreatitis and abdominal aortic aneurysm onset unknown  3/11 PCCM consulted for need BP control  Interim History / Subjective:  Remains on Cardene infusion Stated abdominal pain is almost gone   Objective   Blood pressure 128/72, pulse 72, temperature 97.8 F (36.6 C), temperature source Oral, resp. rate 18, height '5\' 10"'$  (1.778 m), weight 102.1 kg, SpO2 98 %.        Intake/Output Summary  (Last 24 hours) at 09/20/2022 0856 Last data filed at 09/20/2022 0800 Gross per 24 hour  Intake 3783.31 ml  Output 2000 ml  Net 1783.31 ml   Filed Weights   09/18/22 2336  Weight: 102.1 kg   Examination: Physical exam: General: Middle-age obese male, lying on the bed HEENT: [place]/AT, eyes anicteric.  moist mucus membranes Neuro: Alert, awake following commands Chest: Coarse breath sounds, no wheezes or rhonchi Heart: Regular rate and rhythm, no murmurs or gallops Abdomen: Soft, mildly tender in the epigastrium, nondistended, bowel sounds present Skin: No rash  Labs and images were reviewed  Resolved Hospital Problem list     Assessment & Plan:  Type B aortic dissection  Originating from supracellac aorta and terminates into the abdominal aortic aneurysm measuring 7m at greatest dimension   Appreciate vascular surgery follow-up Goal HR < 80 SBP < 120 Continue to titrate Cardene infusion Continue amlodipine 10 mg once daily and hydralazine Increase Coreg to 12.5 mg twice daily Continuous telemetry  Pain controll as below with bowel regimen SCDs for now for VTE ppx  Monitor renal function closely   Acute pancreatitis in setting of hypertriglyceridemia Patient presented with abdominal pain, CTA ABD on admit with acute pancreatis, no pseudocyst.  Lipase 30 Triglycerides > 5,000 on admit  Repeat triglyceride level is 1462 Continue insulin infusion with D20 to prevent hypoglycemia  Trend triglycerides daily Encourage enteral hydration as well > taking full liquid diet Ensure adequate pain control, PRN Dilaudid Started on fibrates  Poorly controlled diabetes Type 2 Patient's hemoglobin A1c is 10.2 Hold metformin Currently on insulin infusion for hypertriglyceridemia Monitor  fingerstick every hour with CBG goal 140-180  ETOH abuse  Last drink 3/10 PM.  Denies prior withdrawal symptoms in past  Continue CIWA with prn ativan Daily thiamine/ folic/ MVI Cessation counseling  provided  Hypokalemia In the setting of insulin infusion Continue aggressive electrolyte supplement and monitor Continue telemetry monitoring  Obesity Diet and exercise counseling provided Dietitian is following   Best Practice (right click and "Reselect all SmartList Selections" daily)   Diet/type: Full Liquid diet DVT prophylaxis: SCD GI prophylaxis: N/A Lines: N/A Foley:  N/A Code Status:  full code Last date of multidisciplinary goals of care discussion [3/11], Patient and wife at bedside updated on plan of care.  Labs   CBC: Recent Labs  Lab 09/18/22 2347 09/19/22 0551 09/20/22 0035  WBC 12.5* 10.4 10.3  NEUTROABS 7.8* 6.9  --   HGB 16.1 14.9 14.3  HCT 44.3 39.8 39.2  MCV 81.9 82.9 83.1  PLT 212 170 0000000    Basic Metabolic Panel: Recent Labs  Lab 09/18/22 2347 09/19/22 0551 09/19/22 1044 09/20/22 0035  NA 135 136 134* 135  K 4.5 4.5 3.6 2.5*  CL 97* 102 100 102  CO2 27 18* 22 22  GLUCOSE 358* 318* 211* 94  BUN '19 16 14 8  '$ CREATININE 0.99 0.89 0.62 0.65  CALCIUM 9.5 8.4* 8.2* 7.9*  MG  --  2.0  --  1.9  PHOS  --  3.6  --   --    GFR: Estimated Creatinine Clearance: 121.9 mL/min (by C-G formula based on SCr of 0.65 mg/dL). Recent Labs  Lab 09/18/22 2347 09/19/22 0551 09/19/22 0740 09/19/22 0938 09/19/22 1714 09/20/22 0035  WBC 12.5* 10.4  --   --   --  10.3  LATICACIDVEN  --   --  2.4* 2.2* 2.0*  --     Liver Function Tests: Recent Labs  Lab 09/18/22 2347 09/19/22 0551 09/20/22 0035  AST 24 26 12*  ALT '25 16 15  '$ ALKPHOS 105 80 63  BILITOT 1.1 1.2 0.3  PROT 7.2 6.0* 5.3*  ALBUMIN 4.5 3.6 3.0*   Recent Labs  Lab 09/18/22 2347  LIPASE 30   No results for input(s): "AMMONIA" in the last 168 hours.  ABG No results found for: "PHART", "PCO2ART", "PO2ART", "HCO3", "TCO2", "ACIDBASEDEF", "O2SAT"   Coagulation Profile: No results for input(s): "INR", "PROTIME" in the last 168 hours.  Cardiac Enzymes: No results for input(s):  "CKTOTAL", "CKMB", "CKMBINDEX", "TROPONINI" in the last 168 hours.  HbA1C: Hgb A1c MFr Bld  Date/Time Value Ref Range Status  09/19/2022 05:51 AM 10.2 (H) 4.8 - 5.6 % Final    Comment:    (NOTE)         Prediabetes: 5.7 - 6.4         Diabetes: >6.4         Glycemic control for adults with diabetes: <7.0     CBG: Recent Labs  Lab 09/20/22 0129 09/20/22 0248 09/20/22 0444 09/20/22 0551 09/20/22 0732  GLUCAP 81 111* 76 142* 99    This patient is critically ill with multiple organ system failure which requires frequent high complexity decision making, assessment, support, evaluation, and titration of therapies. This was completed through the application of advanced monitoring technologies and extensive interpretation of multiple databases.  During this encounter critical care time was devoted to patient care services described in this note for 36 minutes.    Jacky Kindle, MD [facility] See Amion for pager If no response  to pager, please call [phone] until 7pm After 7pm, Please call E-link [phone]

## 2022-09-20 NOTE — Progress Notes (Addendum)
  Progress Note    09/20/2022 7:49 AM * No surgery found *  Subjective:  says his abdominal pain has mostly gone away    Vitals:   09/20/22 0645 09/20/22 0700  BP: 128/89 128/82  Pulse: 63 69  Resp: 17 19  Temp:    SpO2: 96% 97%    Physical Exam: General:  resting comfortably Cardiac:  regular, HR <80 Lungs:  nonlabored Extremities:  palpable DP pulses bilaterally, motor and sensation intact in BLE Abdomen:  soft, minimally tender in epigastric region  CBC    Component Value Date/Time   WBC 10.3 09/20/2022 0035   RBC 4.72 09/20/2022 0035   HGB 14.3 09/20/2022 0035   HCT 39.2 09/20/2022 0035   PLT 153 09/20/2022 0035   MCV 83.1 09/20/2022 0035   MCH 30.3 09/20/2022 0035   MCHC 36.5 (H) 09/20/2022 0035   RDW 12.4 09/20/2022 0035   LYMPHSABS 2.4 09/19/2022 0551   MONOABS 0.7 09/19/2022 0551   EOSABS 0.3 09/19/2022 0551   BASOSABS 0.0 09/19/2022 0551    BMET    Component Value Date/Time   NA 135 09/20/2022 0035   K 2.5 (LL) 09/20/2022 0035   CL 102 09/20/2022 0035   CO2 22 09/20/2022 0035   GLUCOSE 94 09/20/2022 0035   BUN 8 09/20/2022 0035   CREATININE 0.65 09/20/2022 0035   CALCIUM 7.9 (L) 09/20/2022 0035   GFRNONAA >60 09/20/2022 0035   GFRAA >60 05/02/2018 1252    INR No results found for: "INR"   Intake/Output Summary (Last 24 hours) at 09/20/2022 0749 Last data filed at 09/20/2022 0600 Gross per 24 hour  Intake 3030.57 ml  Output 1800 ml  Net 1230.57 ml      Assessment/Plan:  57 y.o. male with type B aortic dissection    -Patient reports passing a lot of flatus yesterday which relieved most of his abdominal pressure. Minimal abdominal pain now  -Still mildly tender in epigastric region to palpation -BLE warm and well perfused. Palpable DP pulses bilaterally with intact motor and sensation -Continue impulse control with HR <80 and SBP <120. Will repeat aorta imaging tomorrow or sooner if any new or worsening symptoms  Vicente Serene,  [facility] [phone] 09/20/2022 7:49 AM   VASCULAR STAFF ADDENDUM: I have independently interviewed and examined the patient. I agree with the above.   Patient transferred to the ICU for better blood pressure management.  His symptoms are improving, consistent with resolving pancreatitis.  Good impulse control on Cardene drip.  Exam remains reassuring.  Counseled patient and his wife about dissection disease and optimal management going forward.  Ideally, we would treat this medically in the acute phase (over the next 2-4 weeks).  I do think he would benefit from repair once recovered from the above in the next 1-2 months.  Endovascular repair would be highly challenging technically; there is no FDA approved device or system available to treat his entire aortic pathology.   Open thoracoabdominal aortic repair from a retroperitoneal approach may be the best repair for him given his young age, but would expose him to significant perioperative risk.   Yevonne Aline. Stanford Breed, MD [facility] Phone Number: [phone] 09/20/2022 10:59 AM

## 2022-09-20 NOTE — Progress Notes (Addendum)
[place] Progress Note Patient Name: Aamir Mcgrue DOB: 25-Nov-1965 MRN: [id number]   Date of Service  09/20/2022  HPI/Events of Note  Dissecting AAA present on admission.   Vascular surgery recommends:  Recommend impulse control with HR < 80 and SBP < 120. If unable to do this in the stepdown unit, recommend transfer to the ICU.   eICU Interventions  Hold esmolol drip for now, titrate therapy orders to heart rate less than 80    0214 - K+2.5   Mag 1.9  -> Replacements ordered   Intervention Category Intermediate Interventions: Arrhythmia - evaluation and management  Iesha Summerhill 09/20/2022, 12:07 AM

## 2022-09-20 NOTE — Progress Notes (Signed)
[place] Progress Note Patient Name: Talib Johannessen DOB: [birth year] MRN: [id number]   Date of Service  09/20/2022  HPI/Events of Note  Blood glucose 70 mg / dl on Insulin gtt at 0.2 units  / hour.  eICU Interventions  D 10 % water increased to 40 ml / hour.        Frederik Pear 09/20/2022, 8:46 PM

## 2022-09-20 NOTE — TOC Benefit Eligibility Note (Signed)
Patient Teacher, English as a foreign language completed.    The patient is currently admitted and upon discharge could be taking Vascepa 1 g capsules.  The current 30 day co-pay is $31.25.   The patient is insured through [employer], [place] Patient Advocate Specialist [employer] Patient Advocate Team Direct Number: [phone]  Fax: [phone]

## 2022-09-21 ENCOUNTER — Inpatient Hospital Stay (HOSPITAL_COMMUNITY): Payer: BC Managed Care – PPO

## 2022-09-21 DIAGNOSIS — I10 Essential (primary) hypertension: Secondary | ICD-10-CM | POA: Diagnosis not present

## 2022-09-21 DIAGNOSIS — E781 Pure hyperglyceridemia: Secondary | ICD-10-CM | POA: Diagnosis not present

## 2022-09-21 DIAGNOSIS — I7102 Dissection of abdominal aorta: Secondary | ICD-10-CM | POA: Diagnosis not present

## 2022-09-21 DIAGNOSIS — K859 Acute pancreatitis without necrosis or infection, unspecified: Secondary | ICD-10-CM | POA: Diagnosis not present

## 2022-09-21 LAB — BASIC METABOLIC PANEL
Anion gap: 8 (ref 5–15)
Anion gap: 9 (ref 5–15)
BUN: <5 mg/dL — ABNORMAL LOW (ref 6–20)
BUN: 8 mg/dL (ref 6–20)
CO2: 19 mmol/L — ABNORMAL LOW (ref 22–32)
CO2: 22 mmol/L (ref 22–32)
Calcium: 8.4 mg/dL — ABNORMAL LOW (ref 8.9–10.3)
Calcium: 8.7 mg/dL — ABNORMAL LOW (ref 8.9–10.3)
Chloride: 108 mmol/L (ref 98–111)
Chloride: 103 mmol/L (ref 98–111)
Creatinine, Ser: 0.73 mg/dL (ref 0.61–1.24)
Creatinine, Ser: 0.85 mg/dL (ref 0.61–1.24)
GFR, Estimated: >60 mL/min (ref >60)
GFR, Estimated: >60 mL/min (ref >60)
Glucose, Bld: 143 mg/dL — ABNORMAL HIGH (ref 70–99)
Glucose, Bld: 85 mg/dL (ref 70–99)
Potassium: 4.1 mmol/L (ref 3.5–5.1)
Potassium: 4.5 mmol/L (ref 3.5–5.1)
Sodium: 135 mmol/L (ref 135–145)
Sodium: 134 mmol/L — ABNORMAL LOW (ref 135–145)

## 2022-09-21 LAB — GLUCOSE, CAPILLARY
Glucose-Capillary: 101 mg/dL — ABNORMAL HIGH (ref 70–99)
Glucose-Capillary: 106 mg/dL — ABNORMAL HIGH (ref 70–99)
Glucose-Capillary: 107 mg/dL — ABNORMAL HIGH (ref 70–99)
Glucose-Capillary: 112 mg/dL — ABNORMAL HIGH (ref 70–99)
Glucose-Capillary: 113 mg/dL — ABNORMAL HIGH (ref 70–99)
Glucose-Capillary: 113 mg/dL — ABNORMAL HIGH (ref 70–99)
Glucose-Capillary: 115 mg/dL — ABNORMAL HIGH (ref 70–99)
Glucose-Capillary: 129 mg/dL — ABNORMAL HIGH (ref 70–99)
Glucose-Capillary: 146 mg/dL — ABNORMAL HIGH (ref 70–99)
Glucose-Capillary: 151 mg/dL — ABNORMAL HIGH (ref 70–99)
Glucose-Capillary: 157 mg/dL — ABNORMAL HIGH (ref 70–99)
Glucose-Capillary: 69 mg/dL — ABNORMAL LOW (ref 70–99)
Glucose-Capillary: 81 mg/dL (ref 70–99)
Glucose-Capillary: 82 mg/dL (ref 70–99)
Glucose-Capillary: 87 mg/dL (ref 70–99)
Glucose-Capillary: 96 mg/dL (ref 70–99)
Glucose-Capillary: 97 mg/dL (ref 70–99)

## 2022-09-21 LAB — TRIGLYCERIDES
Triglycerides: 933 mg/dL — ABNORMAL HIGH (ref <150)
Triglycerides: 998 mg/dL — ABNORMAL HIGH (ref <150)

## 2022-09-21 LAB — MAGNESIUM: Magnesium: 2 mg/dL (ref 1.7–2.4)

## 2022-09-21 LAB — PHOSPHORUS: Phosphorus: 3.3 mg/dL (ref 2.5–4.6)

## 2022-09-21 MED ORDER — POTASSIUM CHLORIDE 20 MEQ PO PACK
20.0000 meq | PACK | Freq: Two times a day (BID) | ORAL | Status: AC
  Administered 2022-09-21 – 2022-09-22 (×4): 20 meq via ORAL
  Filled 2022-09-21 (×4): qty 1

## 2022-09-21 MED ORDER — FUROSEMIDE 10 MG/ML IJ SOLN
40.0000 mg | Freq: Two times a day (BID) | INTRAMUSCULAR | Status: DC
  Administered 2022-09-21 – 2022-09-22 (×3): 40 mg via INTRAVENOUS
  Filled 2022-09-21 (×3): qty 4

## 2022-09-21 MED ORDER — SPIRONOLACTONE 25 MG PO TABS
25.0000 mg | ORAL_TABLET | Freq: Every day | ORAL | Status: DC
  Administered 2022-09-21 – 2022-09-24 (×4): 25 mg via ORAL
  Filled 2022-09-21 (×4): qty 1

## 2022-09-21 MED ORDER — HYDRALAZINE HCL 50 MG PO TABS
100.0000 mg | ORAL_TABLET | Freq: Three times a day (TID) | ORAL | Status: DC
  Administered 2022-09-21 – 2022-09-24 (×8): 100 mg via ORAL
  Filled 2022-09-21 (×9): qty 2

## 2022-09-21 MED ORDER — IOHEXOL 350 MG/ML SOLN
100.0000 mL | Freq: Once | INTRAVENOUS | Status: AC | PRN
  Administered 2022-09-21: 100 mL via INTRAVENOUS

## 2022-09-21 MED ORDER — CARVEDILOL 25 MG PO TABS
25.0000 mg | ORAL_TABLET | Freq: Two times a day (BID) | ORAL | Status: DC
  Administered 2022-09-21 – 2022-09-24 (×7): 25 mg via ORAL
  Filled 2022-09-21 (×7): qty 1

## 2022-09-21 NOTE — Progress Notes (Signed)
  Progress Note    09/21/2022 8:57 AM * No surgery found *  Subjective:  stomach pain is gone    Vitals:   09/21/22 0637 09/21/22 0645  BP: (!) 145/77   Pulse:  84  Resp:  (!) 21  Temp:    SpO2:  97%    Physical Exam: Cardiac:  regular, HR<80 Lungs:  nonlabored Extremities:  palpable DP pulses bilaterally Abdomen:  soft, nontender  CBC    Component Value Date/Time   WBC 10.3 09/20/2022 0035   RBC 4.72 09/20/2022 0035   HGB 14.3 09/20/2022 0035   HCT 39.2 09/20/2022 0035   PLT 153 09/20/2022 0035   MCV 83.1 09/20/2022 0035   MCH 30.3 09/20/2022 0035   MCHC 36.5 (H) 09/20/2022 0035   RDW 12.4 09/20/2022 0035   LYMPHSABS 2.4 09/19/2022 0551   MONOABS 0.7 09/19/2022 0551   EOSABS 0.3 09/19/2022 0551   BASOSABS 0.0 09/19/2022 0551    BMET    Component Value Date/Time   NA 135 09/21/2022 0032   K 4.1 09/21/2022 0032   CL 108 09/21/2022 0032   CO2 19 (L) 09/21/2022 0032   GLUCOSE 143 (H) 09/21/2022 0032   BUN <5 (L) 09/21/2022 0032   CREATININE 0.73 09/21/2022 0032   CALCIUM 8.4 (L) 09/21/2022 0032   GFRNONAA >60 09/21/2022 0032   GFRAA >60 05/02/2018 1252    INR No results found for: "INR"   Intake/Output Summary (Last 24 hours) at 09/21/2022 0857 Last data filed at 09/21/2022 0700 Gross per 24 hour  Intake 3749.6 ml  Output 4350 ml  Net -600.4 ml      Assessment/Plan:  57 y.o. male with Type B aortic dissection   -No abdominal pain this morning, tolerating liquid diet currently -Palpable pedal pulses with intact motor and sensation of BLE -Good impulse control currently -Will repeat CTA scan today   Vicente Serene, PA-C [facility] [phone] 09/21/2022 8:57 AM   VASCULAR STAFF ADDENDUM: I have independently interviewed and examined the patient. I agree with the above.   Late entry. Seen yesterday after scan completed. CT angiogram reviewed with patient.  Will plan non-operative management for now. Will follow  along.  Yevonne Aline. Stanford Breed, MD [facility] Phone Number: [phone] 09/22/2022 7:28 AM

## 2022-09-21 NOTE — Inpatient Diabetes Management (Signed)
Inpatient Diabetes Program Recommendations  AACE/ADA: New Consensus Statement on Inpatient Glycemic Control (2015)  Target Ranges:  Prepandial:   less than 140 mg/dL      Peak postprandial:   less than 180 mg/dL (1-2 hours)      Critically ill patients:  140 - 180 mg/dL   Lab Results  Component Value Date   GLUCAP 69 (L) 09/21/2022   HGBA1C 10.2 (H) 09/19/2022    Review of Glycemic Control  Diabetes history: New Diagnosis Diabetes/PreDiabetes for the last few years Outpatient Diabetes medications: None Current orders for Inpatient glycemic control:  IV insulin 20.4 units/hour, D10 fluid 40 ml/hour  A1c 10.2% this admission  Spoke with pt family at bedside as pt was resting after medication administration. Generally went over A1c level and lifestyle modifications. Explained how we need to transition pt off of IV insulin to see what requirements he has for glucose control at baseline without dextrose fluids. Will follow pt closely and educate when appropriate.  Inpatient Diabetes Program Recommendations:    Transition from IV insulin to Novolog correction scale Q4 hours when appropriate after Triglyceride level correction.  Thanks,  Tama Headings RN, MSN, BC-ADM Inpatient Diabetes Coordinator Team Pager [phone] (8a-5p)

## 2022-09-21 NOTE — Progress Notes (Signed)
NAME:  Patrick [name], MRN:  [id number], DOB:  09/01/1965, LOS: 2 ADMISSION DATE:  09/18/2022, CONSULTATION DATE:  09/19/22 REFERRING MD:  Dr. Karleen Hampshire - TR[facility], CHIEF COMPLAINT:  Hypertension    History of Present Illness:  Patrick [name] is a 57 y.o. male with a PMH significant for HTN, kidney stones, hypertriglyceridemia, and alcohol use who presented to the ED 3/10 with reports intermittent epigastric/ LUQ pain for one week but developed of sudden onset severe chest pain that radiated to his back last evening. Workup on admission revealed acute pancreatitis with abdominal aortic dissection extending from aortic hiatus down to the distal infrarenal abdominal aorta. Patient was originally admitted per [facility] with vascular surgery consult. Vascular recommended medical management to include HR < 80 and SBP < 120.  Additionally placed on insulin/ D5W for hypertriglyceridemia.  Given the need for IV drip to maintain hemodynamic control PCCM consulted for further management and transfer to ICU.  Former smoker.  ETOH use of both beer and liquor around 4-5 drinks nightly.  Denies previous withdrawal symptoms or current symptoms.  No illicit drug use.  Reports mostly compliant with taking hypertension meds but will sometimes miss evening doses.    Pertinent  Medical History  HTN, kidney stones, hypertriglyceridemia, and alcohol use  Significant Hospital Events: Including procedures, antibiotic start and stop dates in addition to other pertinent events   3/10 Presented with sudden onset severe chest pain found to have acute pancreatitis and abdominal aortic aneurysm onset unknown  3/11 PCCM consulted for need BP control  Interim History / Subjective:  Continues on Cardene, has BP spikes as he wakes up. Feels ankles are a bit swollen/tight. Asking to eat. Abd pain improved.  Objective   Blood pressure (!) 145/77, pulse 84, temperature 97.9 F (36.6 C), temperature source Oral, resp. rate (!) 21, height '5\' 10"'$  (1.778  m), weight 102.1 kg, SpO2 97 %.        Intake/Output Summary (Last 24 hours) at 09/21/2022 0811 Last data filed at 09/21/2022 0700 Gross per 24 hour  Intake 3749.6 ml  Output 4350 ml  Net -600.4 ml    Filed Weights   09/18/22 2336  Weight: 102.1 kg   Examination: General: Middle-age obese male, sitting in chair, in NAD HEENT: [place]/AT, eyes anicteric.  moist mucus membranes Neuro: Alert, awake following commands Chest: Normal effort. CTAB Heart: RRR, no M/R/G Abdomen: BS x 4, S/NT/ND Extremities: pretibial edema Skin: No rash  Assessment & Plan:   Type B aortic dissection - Originating from supracellac aorta and terminates into the abdominal aortic aneurysm measuring 21m at greatest dimension. Vascular surgery following, appreciate the assistance Goal HR < 80 SBP < 120 Continue Cardene infusion Continue amlodipine, coreg, cozaar, increase hydralazine Add lasix BID while getting volume from cardene SCDs for now for VTE ppx  Monitor renal function closely   Acute pancreatitis in setting of hypertriglyceridemia Continue insulin infusion with D20 to prevent hypoglycemia  Trend triglycerides daily Encourage enteral hydration as well > taking full liquid diet, can consider advance to regular diet in AM Ensure adequate pain control, PRN Dilaudid Started on fibrates  Poorly controlled diabetes Type 2 - Patient's hemoglobin A1c is 10.2 Hold metformin Currently on insulin infusion for hypertriglyceridemia Monitor fingerstick every hour with CBG goal 140-180  ETOH abuse  Last drink 3/10 PM.  Denies prior withdrawal symptoms in past  Continue CIWA with prn ativan Daily thiamine/ folic/ MVI Cessation counseling provided  Hypokalemia - In the setting of insulin infusion  Continue repletion  Obesity Diet and exercise counseling provided Dietitian is following   Best Practice (right click and "Reselect all SmartList Selections" daily)   Diet/type: Full Liquid diet, likely  transition to regular diet later this PM vs AM 3/14 DVT prophylaxis: SCD GI prophylaxis: N/A Lines: N/A Foley:  N/A Code Status:  full code Last date of multidisciplinary goals of care discussion [3/11], Patient and wife at bedside updated on plan of care.   CC time: 30 min.   Montey Hora, [place] [facility] For pager details, please see AMION or use Epic chat  After 1900, please call [facility] for cross coverage needs 09/21/2022, 8:52 AM

## 2022-09-22 ENCOUNTER — Other Ambulatory Visit (HOSPITAL_COMMUNITY): Payer: Self-pay

## 2022-09-22 DIAGNOSIS — I7102 Dissection of abdominal aorta: Secondary | ICD-10-CM | POA: Diagnosis not present

## 2022-09-22 DIAGNOSIS — I10 Essential (primary) hypertension: Secondary | ICD-10-CM | POA: Diagnosis not present

## 2022-09-22 DIAGNOSIS — K859 Acute pancreatitis without necrosis or infection, unspecified: Secondary | ICD-10-CM | POA: Diagnosis not present

## 2022-09-22 LAB — MAGNESIUM: Magnesium: 1.8 mg/dL (ref 1.7–2.4)

## 2022-09-22 LAB — GLUCOSE, CAPILLARY
Glucose-Capillary: 103 mg/dL — ABNORMAL HIGH (ref 70–99)
Glucose-Capillary: 116 mg/dL — ABNORMAL HIGH (ref 70–99)
Glucose-Capillary: 118 mg/dL — ABNORMAL HIGH (ref 70–99)
Glucose-Capillary: 121 mg/dL — ABNORMAL HIGH (ref 70–99)
Glucose-Capillary: 129 mg/dL — ABNORMAL HIGH (ref 70–99)
Glucose-Capillary: 134 mg/dL — ABNORMAL HIGH (ref 70–99)
Glucose-Capillary: 181 mg/dL — ABNORMAL HIGH (ref 70–99)
Glucose-Capillary: 228 mg/dL — ABNORMAL HIGH (ref 70–99)
Glucose-Capillary: 230 mg/dL — ABNORMAL HIGH (ref 70–99)
Glucose-Capillary: 232 mg/dL — ABNORMAL HIGH (ref 70–99)
Glucose-Capillary: 237 mg/dL — ABNORMAL HIGH (ref 70–99)
Glucose-Capillary: 57 mg/dL — ABNORMAL LOW (ref 70–99)
Glucose-Capillary: 64 mg/dL — ABNORMAL LOW (ref 70–99)

## 2022-09-22 LAB — BASIC METABOLIC PANEL
Anion gap: 7 (ref 5–15)
BUN: 14 mg/dL (ref 6–20)
CO2: 23 mmol/L (ref 22–32)
Calcium: 8.7 mg/dL — ABNORMAL LOW (ref 8.9–10.3)
Chloride: 105 mmol/L (ref 98–111)
Creatinine, Ser: 0.89 mg/dL (ref 0.61–1.24)
GFR, Estimated: >60 mL/min (ref >60)
Glucose, Bld: 88 mg/dL (ref 70–99)
Potassium: 3.7 mmol/L (ref 3.5–5.1)
Sodium: 135 mmol/L (ref 135–145)

## 2022-09-22 LAB — TRIGLYCERIDES: Triglycerides: 687 mg/dL — ABNORMAL HIGH (ref <150)

## 2022-09-22 LAB — PHOSPHORUS: Phosphorus: 4.6 mg/dL (ref 2.5–4.6)

## 2022-09-22 MED ORDER — LIVING WELL WITH DIABETES BOOK
Freq: Once | Status: AC
  Filled 2022-09-22: qty 1

## 2022-09-22 MED ORDER — LORAZEPAM 2 MG/ML IJ SOLN: 1.0000 mg | INTRAMUSCULAR | Status: DC | PRN

## 2022-09-22 MED ORDER — MAGNESIUM SULFATE 2 GM/50ML IV SOLN
2.0000 g | Freq: Once | INTRAVENOUS | Status: AC
  Administered 2022-09-22: 2 g via INTRAVENOUS
  Filled 2022-09-22: qty 50

## 2022-09-22 MED ORDER — CLONIDINE HCL 0.1 MG PO TABS
0.1000 mg | ORAL_TABLET | Freq: Three times a day (TID) | ORAL | Status: DC
  Administered 2022-09-22: 0.1 mg via ORAL
  Filled 2022-09-22: qty 1

## 2022-09-22 MED ORDER — CLONIDINE HCL 0.1 MG PO TABS
0.1000 mg | ORAL_TABLET | Freq: Two times a day (BID) | ORAL | Status: DC
  Administered 2022-09-22 – 2022-09-24 (×4): 0.1 mg via ORAL
  Filled 2022-09-22 (×4): qty 1

## 2022-09-22 MED ORDER — INSULIN ASPART 100 UNIT/ML IJ SOLN
0.0000 [IU] | INTRAMUSCULAR | Status: DC
  Administered 2022-09-22 – 2022-09-23 (×5): 5 [IU] via SUBCUTANEOUS
  Administered 2022-09-23: 8 [IU] via SUBCUTANEOUS
  Administered 2022-09-23: 3 [IU] via SUBCUTANEOUS

## 2022-09-22 MED ORDER — LORAZEPAM 1 MG PO TABS
1.0000 mg | ORAL_TABLET | ORAL | Status: DC | PRN
  Administered 2022-09-22: 1 mg via ORAL
  Filled 2022-09-22: qty 1

## 2022-09-22 NOTE — Progress Notes (Signed)
NAME:  Aron Wolffe, MRN:  [id number], DOB:  [birth year], LOS: 3 ADMISSION DATE:  09/18/2022, CONSULTATION DATE:  09/19/22 REFERRING MD:  Dr. Karleen Hampshire - [facility], CHIEF COMPLAINT:  Hypertension    History of Present Illness:  Alexandra Mosqueda is a 57 y.o. male with a PMH significant for HTN, kidney stones, hypertriglyceridemia, and alcohol use who presented to the ED 3/10 with reports intermittent epigastric/ LUQ pain for one week but developed of sudden onset severe chest pain that radiated to his back last evening. Workup on admission revealed acute pancreatitis with abdominal aortic dissection extending from aortic hiatus down to the distal infrarenal abdominal aorta. Patient was originally admitted per [facility] with vascular surgery consult. Vascular recommended medical management to include HR < 80 and SBP < 120.  Additionally placed on insulin/ D5W for hypertriglyceridemia.  Given the need for IV drip to maintain hemodynamic control PCCM consulted for further management and transfer to ICU.  Former smoker.  ETOH use of both beer and liquor around 4-5 drinks nightly.  Denies previous withdrawal symptoms or current symptoms.  No illicit drug use.  Reports mostly compliant with taking hypertension meds but will sometimes miss evening doses.    Pertinent  Medical History  HTN, kidney stones, hypertriglyceridemia, and alcohol use  Significant Hospital Events: Including procedures, antibiotic start and stop dates in addition to other pertinent events   3/10 Presented with sudden onset severe chest pain found to have acute pancreatitis and abdominal aortic aneurysm onset unknown  3/11 PCCM consulted for need BP control  Interim History / Subjective:  Cardene lowered. Hoping to come off today. Adding Clonidine 0.1 TID  Objective   Blood pressure 135/75, pulse 78, temperature 97.9 F (36.6 C), temperature source Oral, resp. rate 18, height '5\' 10"'$  (1.778 m), weight 109 kg, SpO2 99 %.        Intake/Output Summary  (Last 24 hours) at 09/22/2022 0902 Last data filed at 09/22/2022 0800 Gross per 24 hour  Intake 2947.68 ml  Output 6275 ml  Net -3327.32 ml    Filed Weights   09/18/22 2336 09/21/22 2200  Weight: 102.1 kg 109 kg   Examination: General: Middle-age obese male, sitting in chair, in NAD HEENT: [place]/AT, eyes anicteric.  moist mucus membranes Neuro: Alert, awake following commands Chest: Normal effort. CTAB Heart: RRR, no M/R/G Abdomen: BS x 4, S/NT/ND Extremities: pretibial edema Skin: No rash  Assessment & Plan:   Type B aortic dissection - Originating from supracellac aorta and terminates into the abdominal aortic aneurysm measuring 34m at greatest dimension. Vascular surgery following, appreciate the assistance. Plans for surgical repair at some point in near future Goal HR < 80 SBP < 120 Continue Cardene infusion and wean to off Continue amlodipine, coreg, cozaar, hydralazine, spiro Add Clonidine Needs outpatient PCP establishment and close followup D/c Lasix after this AM dose, already down about 7L  SCDs for now for VTE ppx  Monitor renal function closely   Acute pancreatitis in setting of hypertriglyceridemia Hoping to d/c insulin gtt today, awaiting repeat trig level Continue D10 and stop once insulin off, he is taking adequate PO already Add SSI Trend triglycerides daily Encourage enteral hydration as well > taking full liquid diet, can consider advance to regular diet in AM Ensure adequate pain control, PRN Dilaudid Started on fibrates  Poorly controlled diabetes Type 2 - Patient's hemoglobin A1c is 10.2 Hold metformin D/c insulin gtt and transition to SSI  ETOH abuse  Last drink 3/10 PM.  Denies prior  withdrawal symptoms in past  Continue CIWA with prn ativan Daily thiamine/ folic/ MVI Cessation counseling provided  Obesity Diet and exercise counseling provided Dietitian is following   If can get off cardene and remains off, will transfer out of ICU to  floor this PM and ask TRH to pickup in AM 3/15.  Best Practice (right click and "Reselect all SmartList Selections" daily)   Diet/type: Regular diet as ordered DVT prophylaxis: SCD GI prophylaxis: N/A Lines: N/A Foley:  N/A Code Status:  full code Last date of multidisciplinary goals of care discussion [3/11], Patient and wife at bedside updated on plan of care.   CC time: 30 min.   Montey Hora, [place] [facility] For pager details, please see AMION or use Epic chat  After 1900, please call [facility] for cross coverage needs 09/22/2022, 9:02 AM

## 2022-09-22 NOTE — Progress Notes (Signed)
Patient yellow metal wedding band removed from finger. Placed in denture cup with patient label on it and is at bedside. Patient is aware. Patient refused to place it with security, will have wife pick it up.  Chavez Rosol Franchot Gallo, RN

## 2022-09-22 NOTE — Progress Notes (Signed)
[place] Progress Note Patient Name: Patrick [name] DOB: [birth year] MRN: [id number]   Date of Service  09/22/2022  HPI/Events of Note  Admitted with type B dissection being treated conservatively, previously on nicardipine/elevated pain and esmolol drips.  Both of which have been titrated off.  Initiated on clonidine and hydralazine.  eICU Interventions  Added hold parameters for clonidine and hydralazine.  Maintain antihypertensives.     Intervention Category Intermediate Interventions: Hypotension - evaluation and management  [name] 09/22/2022, 9:20 PM

## 2022-09-22 NOTE — Progress Notes (Signed)
[facility] ADULT ICU REPLACEMENT PROTOCOL   The patient does apply for the [facility] Adult ICU Electrolyte Replacment Protocol based on the criteria listed below:   1.Exclusion criteria: TCTS, ECMO, Dialysis, and Myasthenia Gravis patients 2. Is GFR >/= 30 ml/min? Yes.    Patient's GFR today is >60 3. Is SCr </= 2? Yes.   Patient's SCr is 0.89 mg/dL 4. Did SCr increase >/= 0.5 in 24 hours? No. 5.Pt's weight >40kg  Yes.   6. Abnormal electrolyte(s): Magnesium 1.8  7. Electrolytes replaced per protocol 8.  Call MD STAT for K+ </= 2.5, Phos </= 1, or Mag </= 1 Physician:  Dr. Harlon Flor A Candelaria Pies 09/22/2022 2:42 AM

## 2022-09-22 NOTE — Progress Notes (Addendum)
  Progress Note    09/22/2022 7:52 AM * No surgery found *  Subjective:  no complaints    Vitals:   09/22/22 0700 09/22/22 0739  BP: (!) 153/86 128/69  Pulse: 72 75  Resp:  18  Temp:  97.9 F (36.6 C)  SpO2: 98% 98%    Physical Exam: General:  no acute distress Cardiac:  regular, HR <80 and SBPs 120s-130s overnight Lungs:  nonlabored Extremities:  palpable DP pulses bilaterally 2+ Abdomen:  soft, NT, ND  CBC    Component Value Date/Time   WBC 10.3 09/20/2022 0035   RBC 4.72 09/20/2022 0035   HGB 14.3 09/20/2022 0035   HCT 39.2 09/20/2022 0035   PLT 153 09/20/2022 0035   MCV 83.1 09/20/2022 0035   MCH 30.3 09/20/2022 0035   MCHC 36.5 (H) 09/20/2022 0035   RDW 12.4 09/20/2022 0035   LYMPHSABS 2.4 09/19/2022 0551   MONOABS 0.7 09/19/2022 0551   EOSABS 0.3 09/19/2022 0551   BASOSABS 0.0 09/19/2022 0551    BMET    Component Value Date/Time   NA 135 09/22/2022 0000   K 3.7 09/22/2022 0000   CL 105 09/22/2022 0000   CO2 23 09/22/2022 0000   GLUCOSE 88 09/22/2022 0000   BUN 14 09/22/2022 0000   CREATININE 0.89 09/22/2022 0000   CALCIUM 8.7 (L) 09/22/2022 0000   GFRNONAA >60 09/22/2022 0000   GFRAA >60 05/02/2018 1252    INR No results found for: "INR"   Intake/Output Summary (Last 24 hours) at 09/22/2022 0752 Last data filed at 09/22/2022 0700 Gross per 24 hour  Intake 2846.74 ml  Output 6825 ml  Net -3978.26 ml      Assessment/Plan:  57 y.o. male with type B aortic dissection   -Denies any abdominal pain. Tolerating a normal diet and had a BM overnight -Palpable DP pulses bilaterally, intact motor and sensation of BLE -CT scan yesterday demonstrated no interval changes to aortic dissection. Likely will still plan for elective repair in the near future. Surgical plans per Dr.Pheonix Clinkscale -Continue BP and HR control   Vicente Serene, [place] [facility] [phone] 09/22/2022 7:52 AM   VASCULAR STAFF ADDENDUM: I have  independently interviewed and examined the patient. I agree with the above.  Follow up with me in 1 month with CT angiogram of chest / abdomen / pelvis. Continue impulse control: HR < 80; SBP < 120.  Yevonne Aline. Stanford Breed, MD [facility] Phone Number: [phone] 09/22/2022 3:18 PM

## 2022-09-22 NOTE — Inpatient Diabetes Management (Addendum)
Inpatient Diabetes Program Recommendations  AACE/ADA: New Consensus Statement on Inpatient Glycemic Control (2015)  Target Ranges:  Prepandial:   less than 140 mg/dL      Peak postprandial:   less than 180 mg/dL (1-2 hours)      Critically ill patients:  140 - 180 mg/dL   Lab Results  Component Value Date   GLUCAP 230 (H) 09/22/2022   HGBA1C 10.2 (H) 09/19/2022    Review of Glycemic Control  Diabetes history: New Diagnosis Diabetes/PreDiabetes for the last few years Outpatient Diabetes medications: None Current orders for Inpatient glycemic control:  Novolog 0-15 units Q4 hours  Spoke with pt and wife again today at bedside I reviewed with pt his current A1c level. Discussed glucose and A1c goals. Discussed lifestyle modifications through diet. Discussed hypoglycemia s/s and treatment. Briefly discussed when to call his PCP for medication adjustments. Will order the living well with diabetes booklet as pt is more awake today. May do benefits check on CGM will also teach insulin tomorrow in preparation for discharge even though we do not know pt requirements at this time. Diabetes Coordinator is not on campus over the weekend to physically show pt the insulin administration with insulin pen.   CGM: Freestyle Libre 3 $18.52 Dexcom G7  $41.35  Thanks,  Tama Headings RN, MSN, BC-ADM Inpatient Diabetes Coordinator Team Pager [phone] (8a-5p)

## 2022-09-23 ENCOUNTER — Telehealth (HOSPITAL_COMMUNITY): Payer: Self-pay

## 2022-09-23 ENCOUNTER — Other Ambulatory Visit (HOSPITAL_COMMUNITY): Payer: Self-pay

## 2022-09-23 ENCOUNTER — Encounter (HOSPITAL_COMMUNITY): Payer: Self-pay | Admitting: Internal Medicine

## 2022-09-23 DIAGNOSIS — I7102 Dissection of abdominal aorta: Secondary | ICD-10-CM | POA: Diagnosis not present

## 2022-09-23 LAB — GLUCOSE, CAPILLARY
Glucose-Capillary: 202 mg/dL — ABNORMAL HIGH (ref 70–99)
Glucose-Capillary: 256 mg/dL — ABNORMAL HIGH (ref 70–99)
Glucose-Capillary: 162 mg/dL — ABNORMAL HIGH (ref 70–99)
Glucose-Capillary: 160 mg/dL — ABNORMAL HIGH (ref 70–99)
Glucose-Capillary: 177 mg/dL — ABNORMAL HIGH (ref 70–99)

## 2022-09-23 LAB — BASIC METABOLIC PANEL
Anion gap: 11 (ref 5–15)
BUN: 23 mg/dL — ABNORMAL HIGH (ref 6–20)
CO2: 21 mmol/L — ABNORMAL LOW (ref 22–32)
Calcium: 8.7 mg/dL — ABNORMAL LOW (ref 8.9–10.3)
Chloride: 103 mmol/L (ref 98–111)
Creatinine, Ser: 1.21 mg/dL (ref 0.61–1.24)
GFR, Estimated: >60 mL/min (ref >60)
Glucose, Bld: 181 mg/dL — ABNORMAL HIGH (ref 70–99)
Potassium: 4.5 mmol/L (ref 3.5–5.1)
Sodium: 135 mmol/L (ref 135–145)

## 2022-09-23 LAB — MAGNESIUM: Magnesium: 2 mg/dL (ref 1.7–2.4)

## 2022-09-23 LAB — TRIGLYCERIDES: Triglycerides: 445 mg/dL — ABNORMAL HIGH (ref <150)

## 2022-09-23 MED ORDER — INSULIN ASPART 100 UNIT/ML IJ SOLN
0.0000 [IU] | INTRAMUSCULAR | Status: DC
  Administered 2022-09-23 – 2022-09-24 (×5): 4 [IU] via SUBCUTANEOUS

## 2022-09-23 MED ORDER — INSULIN GLARGINE-YFGN 100 UNIT/ML ~~LOC~~ SOLN
10.0000 [IU] | Freq: Every day | SUBCUTANEOUS | Status: DC
  Administered 2022-09-23 – 2022-09-24 (×2): 10 [IU] via SUBCUTANEOUS
  Filled 2022-09-23 (×2): qty 0.1

## 2022-09-23 NOTE — TOC Initial Note (Signed)
Transition of Care [facility]) - Initial/Assessment Note    Patient Details  Name: Patrick [name] MRN: [id number] Date of Birth: 1965/09/16  Transition of Care [facility] CM/SW Contact:    Erenest Rasher, RN Phone Number: [phone] 09/23/2022, 5:59 PM  Clinical Narrative:                 CM spoke to pt and wife at bedside. Pt was independent PTA. Will continue to follow up for dc needs.   Expected Discharge Plan: Home/Self Care Barriers to Discharge: Continued Medical Work up   Patient Goals and CMS Choice Patient states their goals for this hospitalization and ongoing recovery are:: wants to recover          Expected Discharge Plan and Services   Discharge Planning Services: CM Consult   Living arrangements for the past 2 months: Single Family Home                                      Prior Living Arrangements/Services Living arrangements for the past 2 months: Single Family Home Lives with:: Spouse Patient language and need for interpreter reviewed:: Yes Do you feel safe going back to the place where you live?: Yes      Need for Family Participation in Patient Care: No (Comment) Care giver support system in place?: Yes (comment)   Criminal Activity/Legal Involvement Pertinent to Current Situation/Hospitalization: No - Comment as needed  Activities of Daily Living Home Assistive Devices/Equipment: None ADL Screening (condition at time of admission) Patient's cognitive ability adequate to safely complete daily activities?: No Is the patient deaf or have difficulty hearing?: No Does the patient have difficulty seeing, even when wearing glasses/contacts?: No Does the patient have difficulty concentrating, remembering, or making decisions?: No Patient able to express need for assistance with ADLs?: No Does the patient have difficulty dressing or bathing?: No Independently performs ADLs?: Yes (appropriate for developmental age) Does the patient have difficulty  walking or climbing stairs?: No Weakness of Legs: None Weakness of Arms/Hands: None  Permission Sought/Granted Permission sought to share information with : Case Manager, Family Supports Permission granted to share information with : Yes, Verbal Permission Granted  Share Information with NAME: Patrick [name]     Permission granted to share info w Relationship: wife  Permission granted to share info w Contact Information: [phone]  Emotional Assessment Appearance:: Appears stated age Attitude/Demeanor/Rapport: Engaged Affect (typically observed): Accepting Orientation: : Oriented to Self, Oriented to Place, Oriented to  Time, Oriented to Situation   Psych Involvement: No (comment)  Admission diagnosis:  Dissecting AAA (abdominal aortic aneurysm) ([place]) [I71.02] Hyperlipidemia, unspecified hyperlipidemia type [E78.5] Acute pancreatitis, unspecified complication status, unspecified pancreatitis type [K85.90] Patient Active Problem List   Diagnosis Date Noted   Dissecting AAA (abdominal aortic aneurysm) ([place]) 09/19/2022   PCP:  [name], MD Pharmacy:   [id number] - [place] - [address] AT [place] [address] [place] [address] Phone: [phone] Fax: [phone]  [id number] - [place], [place] [address] [place] [address] Phone: [phone] Fax: [phone]     Social Determinants of Health (SDOH) Social History: SDOH Screenings   Tobacco Use: Medium Risk (09/19/2022)   SDOH Interventions:     Readmission Risk Interventions  No data to display

## 2022-09-23 NOTE — Progress Notes (Addendum)
  Progress Note    09/23/2022 7:53 AM * No surgery found *  Subjective:  abdominal pain is gone    Vitals:   09/23/22 0630 09/23/22 0700  BP: (S) (!) 141/84 136/86  Pulse:  68  Resp:    Temp:    SpO2:  100%    Physical Exam: General:  resting comfortably Cardiac:  regular Lungs:  nonlabored Extremities:  palpable DP pulses 2+ Abdomen:  soft, NT, ND  CBC    Component Value Date/Time   WBC 10.3 09/20/2022 0035   RBC 4.72 09/20/2022 0035   HGB 14.3 09/20/2022 0035   HCT 39.2 09/20/2022 0035   PLT 153 09/20/2022 0035   MCV 83.1 09/20/2022 0035   MCH 30.3 09/20/2022 0035   MCHC 36.5 (H) 09/20/2022 0035   RDW 12.4 09/20/2022 0035   LYMPHSABS 2.4 09/19/2022 0551   MONOABS 0.7 09/19/2022 0551   EOSABS 0.3 09/19/2022 0551   BASOSABS 0.0 09/19/2022 0551    BMET    Component Value Date/Time   NA 135 09/23/2022 0333   K 4.5 09/23/2022 0333   CL 103 09/23/2022 0333   CO2 21 (L) 09/23/2022 0333   GLUCOSE 181 (H) 09/23/2022 0333   BUN 23 (H) 09/23/2022 0333   CREATININE 1.21 09/23/2022 0333   CALCIUM 8.7 (L) 09/23/2022 0333   GFRNONAA >60 09/23/2022 0333   GFRAA >60 05/02/2018 1252    INR No results found for: "INR"   Intake/Output Summary (Last 24 hours) at 09/23/2022 0753 Last data filed at 09/23/2022 0200 Gross per 24 hour  Intake 379.33 ml  Output 4800 ml  Net -4420.67 ml      Assessment/Plan:  57 y.o. male with type B aortic dissection  -Abdominal pain is completely resolved now, likely due to treatment for pancreatitis -Repeat CTA showed no interval changes of aortic dissection -Good impulse control. He has palpable DP pulses -We will arrange a follow up with Dr.Eula Jaster in 1 month with CTA chest/abd/pelvis -Clear from vascular standpoint for d/c  Vicente Serene, PA-C [facility] [phone] 09/23/2022 7:53 AM  VASCULAR STAFF ADDENDUM: I agree with the above.  Please call with questions over the weekend.  Yevonne Aline.  Stanford Breed, MD [facility] of [facility] Phone Number: [phone] 09/23/2022 2:35 PM

## 2022-09-23 NOTE — Progress Notes (Signed)
PROGRESS NOTE    Patrick [name]  [id number] DOB: Jan 17, 1966 DOA: 09/18/2022 PCP: [name], MD  Outpatient Specialists:     Brief Narrative:  Patrick [name] is a 57 y.o. male with a PMH significant for HTN, kidney stones, hypertriglyceridemia, and alcohol use who presented to the ED 3/10 with reports intermittent epigastric/ LUQ pain for one week but developed of sudden onset severe chest pain that radiated to his back last evening. Workup on admission revealed acute pancreatitis with abdominal aortic dissection extending from aortic hiatus down to the distal infrarenal abdominal aorta.  Vascular surgery team was consulted.  Vascular surgery has advised keeping heart rate less than 80 bpm and systolic blood pressure less than 120 mmHg.  Patient will follow-up with vascular surgery on outpatient basis in 1 month.  Likely, patient will need repeat CT chest with contrast of the chest, abdomen and pelvis.  Abdominal pain from the pancreatitis..  Patient is on treatment for the hypertriglyceridemia.  Triglyceride has decreased from 2123 to 445 today.  Patient is also newly diagnosed diabetic.  A1c is 10.2%.  Patient is currently on Semglee and sliding scale insulin.  Renal function is normal.  Patient is also obese.  Patient has never been worked up for obstructive sleep apnea.  09/23/2022: Patient seen alongside patient's wife.  No new complaints.  Blood sugar has been optimized.  Change in lifestyle was discussed.   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 Vascular surgery following, appreciate the assistance. Plans for surgical repair at some point in near future Goal HR < 80 SBP < 120 Wean off Cardene drip.   Continue amlodipine, coreg, cozaar, hydralazine, spiro and clonidine. Needs  outpatient PCP establishment and close followup Monitor renal function closely    Acute pancreatitis in setting of hypertriglyceridemia Triglyceride level has improved significantly.   Continue Lipitor.  Poorly controlled diabetes Type 2 - Patient's hemoglobin A1c is 10.2 Metformin is on hold. Patient is currently on Semglee and sliding scale insulin.   Blood sugar control is improving.  ETOH abuse  Last drink 3/10 PM.  Denies prior withdrawal symptoms in past  Daily thiamine/ folic/ MVI Cessation counseling provided   Obesity Diet and exercise counseling provided Dietitian is following   Likely undiagnosed OSA: -For outpatient sleep studies.  Hypertension: -To optimize.   DVT prophylaxis: SCD. Code Status: Full code Family Communication: Wife Disposition Plan: Home in the next 1 to 2 days.   Consultants:  Vascular surgery.  Procedures:  None  Antimicrobials:  None   Subjective: No shortness of breath. No chest pain. No abdominal pain No nausea or vomiting.  Objective: Vitals:   09/23/22 1500 09/23/22 1600 09/23/22 1615 09/23/22 1700  BP: 134/85 121/76  (!) 142/91  Pulse: 63 (!) 59  73  Resp:      Temp:   97.6 F (36.4 C)   TempSrc:   Oral   SpO2: 98% 96%  98%  Weight:      Height:        Intake/Output Summary (Last 24 hours) at 09/23/2022 1835 Last data filed at 09/23/2022 0200 Gross per 24 hour  Intake --  Output 1425 ml  Net -1425 ml   Filed Weights   09/18/22 2336 09/21/22 2200 09/23/22 0630  Weight: 102.1 kg 109 kg 106.6 kg  Examination:  General exam: Appears calm and comfortable.  Patient is obese. Respiratory system: Clear to auscultation.  Cardiovascular system: S1 & S2 heard Gastrointestinal system: Obese, soft and nontender.   Central nervous system: Alert and oriented.  Patient moves all extremities. Extremities: Symmetric 5 x 5 power.   Data Reviewed: I have personally reviewed following labs and imaging  studies  CBC: Recent Labs  Lab 09/18/22 2347 09/19/22 0551 09/20/22 0035  WBC 12.5* 10.4 10.3  NEUTROABS 7.8* 6.9  --   HGB 16.1 14.9 14.3  HCT 44.3 39.8 39.2  MCV 81.9 82.9 83.1  PLT 212 170 0000000   Basic Metabolic Panel: Recent Labs  Lab 09/19/22 0551 09/19/22 1044 09/20/22 0035 09/20/22 0738 09/20/22 1830 09/21/22 0032 09/21/22 0052 09/21/22 1700 09/22/22 0000 09/23/22 0333  NA 136   < > 135   < > 134* 135  --  134* 135 135  K 4.5   < > 2.5*   < > 4.0 4.1  --  4.5 3.7 4.5  CL 102   < > 102   < > 106 108  --  103 105 103  CO2 18*   < > 22   < > 18* 19*  --  22 23 21*  GLUCOSE 318*   < > 94   < > 117* 143*  --  85 88 181*  BUN 16   < > 8   < > <5* <5*  --  8 14 23*  CREATININE 0.89   < > 0.65   < > 0.68 0.73  --  0.85 0.89 1.21  CALCIUM 8.4*   < > 7.9*   < > 8.3* 8.4*  --  8.7* 8.7* 8.7*  MG 2.0  --  1.9  --  2.0  --  2.0  --  1.8 2.0  PHOS 3.6  --   --   --  2.7  --  3.3  --  4.6  --    < > = values in this interval not displayed.   GFR: Estimated Creatinine Clearance: 82.3 mL/min (by C-G formula based on SCr of 1.21 mg/dL). Liver Function Tests: Recent Labs  Lab 09/18/22 2347 09/19/22 0551 09/20/22 0035  AST 24 26 12*  ALT 25 16 15   ALKPHOS 105 80 63  BILITOT 1.1 1.2 0.3  PROT 7.2 6.0* 5.3*  ALBUMIN 4.5 3.6 3.0*   Recent Labs  Lab 09/18/22 2347  LIPASE 30   No results for input(s): "AMMONIA" in the last 168 hours. Coagulation Profile: No results for input(s): "INR", "PROTIME" in the last 168 hours. Cardiac Enzymes: No results for input(s): "CKTOTAL", "CKMB", "CKMBINDEX", "TROPONINI" in the last 168 hours. BNP (last 3 results) No results for input(s): "PROBNP" in the last 8760 hours. HbA1C: No results for input(s): "HGBA1C" in the last 72 hours. CBG: Recent Labs  Lab 09/22/22 2110 09/22/22 2339 09/23/22 0431 09/23/22 0808 09/23/22 1612  GLUCAP 237* 232* 202* 256* 162*   Lipid Profile: Recent Labs    09/22/22 0834 09/23/22 0333  TRIG  687* 445*   Thyroid Function Tests: No results for input(s): "TSH", "T4TOTAL", "FREET4", "T3FREE", "THYROIDAB" in the last 72 hours. Anemia Panel: No results for input(s): "VITAMINB12", "FOLATE", "FERRITIN", "TIBC", "IRON", "RETICCTPCT" in the last 72 hours. Urine analysis: No results found for: "COLORURINE", "APPEARANCEUR", "LABSPEC", "PHURINE", "GLUCOSEU", "HGBUR", "BILIRUBINUR", "KETONESUR", "PROTEINUR", "UROBILINOGEN", "NITRITE", "LEUKOCYTESUR" Sepsis Labs: @LABRCNTIP (procalcitonin:4,lacticidven:4)  ) Recent Results (from the past 240 hour(s))  MRSA Next Gen by PCR, Nasal  Status: None   Collection Time: 09/19/22  6:28 PM   Specimen: Nasal Mucosa; Nasal Swab  Result Value Ref Range Status   MRSA by PCR Next Gen NOT DETECTED NOT DETECTED Final    Comment: (NOTE) The GeneXpert MRSA Assay (FDA approved for NASAL specimens only), is one component of a comprehensive MRSA colonization surveillance program. It is not intended to diagnose MRSA infection nor to guide or monitor treatment for MRSA infections. Test performance is not FDA approved in patients less than 69 years old. Performed at [facility], [place] [address] [place], [place] [address]          Radiology Studies: No results found.      Scheduled Meds:  amLODipine  10 mg Oral Daily   atorvastatin  20 mg Oral Daily   carvedilol  25 mg Oral BID WC   Chlorhexidine Gluconate Cloth  6 each Topical Daily   cloNIDine  0.1 mg Oral BID   fenofibrate  160 mg Oral Daily   folic acid  1 mg Oral Daily   hydrALAZINE  100 mg Oral Q8H   insulin aspart  0-20 Units Subcutaneous Q4H   insulin glargine-yfgn  10 Units Subcutaneous Daily   losartan  100 mg Oral Daily   multivitamin with minerals  1 tablet Oral Daily   spironolactone  25 mg Oral Daily   thiamine  100 mg Oral Daily   Or   thiamine  100 mg Intravenous Daily   Continuous Infusions:  niCARDipine Stopped (09/22/22 1008)     LOS: 4 days    Time  spent: 55 minutes.    Dana Allan, MD  [facility] Pager #: [phone] 7PM-7AM contact night coverage as above

## 2022-09-23 NOTE — TOC Benefit Eligibility Note (Signed)
Patient Teacher, English as a foreign language completed.    The patient is currently admitted and upon discharge could be taking Lantus Solostar.  The current 30 day co-pay is $0.00.   The patient is insured through [employer]

## 2022-09-23 NOTE — Inpatient Diabetes Management (Signed)
Inpatient Diabetes Program Recommendations  AACE/ADA: New Consensus Statement on Inpatient Glycemic Control (2015)  Target Ranges:  Prepandial:   less than 140 mg/dL      Peak postprandial:   less than 180 mg/dL (1-2 hours)      Critically ill patients:  140 - 180 mg/dL   Lab Results  Component Value Date   GLUCAP 256 (H) 09/23/2022   HGBA1C 10.2 (H) 09/19/2022    Review of Glycemic Control  Diabetes history: New Diagnosis  Current orders for Inpatient glycemic control:  Semglee 10 units Novolog 0-15 units Q4 hours  Discharge Recommendations Other recommendations: Freestyle Libre 3 sensors order # [phone] Long acting recommendations: Insulin Glargine (LANTUS) Solostar Pen 10 units Daily  Supply/Referral recommendations: Pen needles - standard Pt may also benefit from SGLT 2 at time of d/c. May benefit from the properties of this medication.  Spoke with patient and wife at bedside in great detail about new diabetes diagnosis.  Discussed basic pathophysiology of DM Type 2, basic home care, importance of checking CBGs and maintaining good CBG control to prevent long-term and short-term complications. Reviewed glucose and A1C goals. Reviewed signs and symptoms of hyperglycemia and hypoglycemia along with treatment for both. Discussed impact of nutrition, exercise, stress, sickness, and medications on diabetes control. Reviewed Living Well with diabetes booklet and and answered all questions they had after they read through the educational booklet.  Discussed pt maybe prescribed a basal insulin daily injection and an oral medication at time of discharge. Discussed basal insulin in detail (how to take it, when to take it) and instructed patient he would begin taking it today. Pt RN came in with Semglee injection and pt successfully administered insulin while I was in the room under the instruction of the RN. Asked patient to check his glucose at least 2 times per day, however pt will be getting  the CGM freestyle Libre 3 for glucose checks while at home.  Explained how the doctor he follows up with can use the log book of glucose readings to continue to make insulin adjustments if needed.   Reviewed and demonstrated how to administer insulin with insulin pen. Pt and wife both performed administration with pen with the demo insulin pens. Patient has no further questions at this time related to diabetes. RNs to provide ongoing basic DM education at bedside with this patient and engage patient to actively check blood glucose and administer insulin injections.    Thanks,  Tama Headings RN, MSN, BC-ADM Inpatient Diabetes Coordinator Team Pager [phone] (8a-5p)

## 2022-09-23 NOTE — Telephone Encounter (Signed)
Pharmacy Patient Advocate Encounter  Insurance verification completed.    The patient is insured through [place]   The patient is currently admitted and ran test claims for the following: Lantus.  Copays and coinsurance results were relayed to Inpatient clinical team.

## 2022-09-24 DIAGNOSIS — I7102 Dissection of abdominal aorta: Secondary | ICD-10-CM | POA: Diagnosis not present

## 2022-09-24 LAB — BASIC METABOLIC PANEL
Anion gap: 7 (ref 5–15)
BUN: 22 mg/dL — ABNORMAL HIGH (ref 6–20)
CO2: 23 mmol/L (ref 22–32)
Calcium: 8.9 mg/dL (ref 8.9–10.3)
Chloride: 104 mmol/L (ref 98–111)
Creatinine, Ser: 0.95 mg/dL (ref 0.61–1.24)
GFR, Estimated: >60 mL/min (ref >60)
Glucose, Bld: 132 mg/dL — ABNORMAL HIGH (ref 70–99)
Potassium: 4 mmol/L (ref 3.5–5.1)
Sodium: 134 mmol/L — ABNORMAL LOW (ref 135–145)

## 2022-09-24 LAB — GLUCOSE, CAPILLARY: Glucose-Capillary: 168 mg/dL — ABNORMAL HIGH (ref 70–99)

## 2022-09-24 MED ORDER — FOLIC ACID 1 MG PO TABS: 1.0000 mg | ORAL_TABLET | Freq: Every day | ORAL | 0 refills | Status: AC

## 2022-09-24 MED ORDER — CLONIDINE HCL 0.1 MG PO TABS: 0.1000 mg | ORAL_TABLET | Freq: Two times a day (BID) | ORAL | 0 refills | Status: DC

## 2022-09-24 MED ORDER — ATORVASTATIN CALCIUM 20 MG PO TABS: 20.0000 mg | ORAL_TABLET | Freq: Every day | ORAL | 0 refills | Status: DC

## 2022-09-24 MED ORDER — LOSARTAN POTASSIUM 100 MG PO TABS: 100.0000 mg | ORAL_TABLET | Freq: Every day | ORAL | 0 refills | Status: DC

## 2022-09-24 MED ORDER — FENOFIBRATE 160 MG PO TABS: 160.0000 mg | ORAL_TABLET | Freq: Every day | ORAL | 0 refills | Status: DC

## 2022-09-24 MED ORDER — INSULIN GLARGINE 100 UNIT/ML SOLOSTAR PEN: 10.0000 [IU] | PEN_INJECTOR | Freq: Every day | SUBCUTANEOUS | 1 refills | Status: DC

## 2022-09-24 MED ORDER — THIAMINE HCL 100 MG/ML IJ SOLN: 100.0000 mg | Freq: Every day | INTRAMUSCULAR | 0 refills | Status: DC

## 2022-09-24 MED ORDER — HYDRALAZINE HCL 100 MG PO TABS: 100.0000 mg | ORAL_TABLET | Freq: Three times a day (TID) | ORAL | 0 refills | Status: DC

## 2022-09-24 MED ORDER — ADULT MULTIVITAMIN W/MINERALS CH: 1.0000 | ORAL_TABLET | Freq: Every day | ORAL | Status: AC

## 2022-09-24 NOTE — Progress Notes (Signed)
Patient's iv's removed, medication education given and patient transported to front entrance via wheelchair and delivered to private vehicle.

## 2022-09-24 NOTE — Discharge Summary (Signed)
Physician Discharge Summary  Patrick [name] [id number] DOB: 04/17/1966 DOA: 09/18/2022  PCP: [name], MD  Admit date: 09/18/2022 Discharge date: 09/24/2022  Admitted From: Home Disposition:  Home  Recommendations for Outpatient Follow-up:  Follow-up with new PCP in 1 week Follow-up on your uncontrolled diabetes.  Start Lantus 10 units daily.  Monitor blood sugars at home. Avoid use of alcohol Monitor blood pressure at home.   Follow-up with vascular surgery Dr. Luan Pulling in 1 month Discuss with your new PCP regarding sleep study   Home Health: None Equipment/Devices: None Discharge Condition: Stable CODE STATUS: Full code Diet recommendation: Low-sodium diet  Brief/Interim Summary: Patrick [name] is a 57 y.o. male with a PMH significant for HTN, kidney stones, hypertriglyceridemia, and alcohol use who presented to the ED 3/10 with reports intermittent epigastric/ LUQ pain for one week but developed of sudden onset severe chest pain that radiated to his back last evening. Workup on admission revealed acute pancreatitis with abdominal aortic dissection extending from aortic hiatus down to the distal infrarenal abdominal aorta.  Vascular surgery team was consulted.   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 -Goal HR < 80 SBP < 120 -Wean off Cardene drip.   -Vascular surgery consulted.  Recommended outpatient follow-up with Dr. Luan Pulling in 1 month with CTA chest/abdomen/pelvis.  Cleared from vascular standpoint for discharge. -Continued amlodipine, coreg, cozaar, hydralazine, spiro and clonidine. -Recommend to monitor blood pressure at home.   Acute pancreatitis in setting of hypertriglyceridemia -Triglyceride level has improved significantly.   -Continued Lipitor and fenofibrate.   Uncontrolled type 2 diabetes mellitus with hyperglycemia- Patient's hemoglobin A1c is  10.2 -Will at home on Lantus 10 units daily.  Recommend to follow-up with PCP and monitor blood sugar at home  ETOH abuse  -Last drink 3/10 PM.  Denies prior withdrawal symptoms in past  -Continued thiamine/ folic/ MVI -Cessation counseling provided   Obesity -Diet and exercise counseling provided   Excessive daytime sleepiness Loud snoring: -Recommended outpatient sleep study to r/o sleep apnea.   Hypertension: -Blood pressure stable on amlodipine, Coreg, Cozaar, hydralazine, spironolactone and clonidine.  Discharge Diagnoses:  Type B aortic dissection Acute pancreatitis in the setting of hypertriglyceridemia and alcohol use Uncontrolled type 2 diabetes with hyperglycemia Alcohol abuse Obesity with BMI of 33 Hypertension    Discharge Instructions  Discharge Instructions     Diet - low sodium heart healthy   Complete by: As directed    Increase activity slowly   Complete by: As directed       Allergies as of 09/24/2022   No Known Allergies      Medication List     STOP taking these medications    gabapentin 300 MG capsule Commonly known as: NEURONTIN   telmisartan 40 MG tablet Commonly known as: MICARDIS       TAKE these medications    acetaminophen 500 MG tablet Commonly known as: TYLENOL Take 500-1,000 mg by mouth every 6 (six) hours as needed for moderate pain.   amLODipine 5 MG tablet Commonly known as: NORVASC Take 5 mg by mouth in the morning and at bedtime.   aspirin 81 MG chewable tablet Chew 325 mg by mouth daily as needed (Chest Pain/Pain).   atorvastatin 20 MG tablet Commonly known as: LIPITOR Take 1 tablet (20 mg total) by mouth daily. Start taking on: September 25, 2022   carvedilol 12.5 MG tablet Commonly known as: COREG  Take 12.5 mg by mouth 2 (two) times daily with a meal.   cloNIDine 0.1 MG tablet Commonly known as: CATAPRES Take 1 tablet (0.1 mg total) by mouth 2 (two) times daily.   fenofibrate 160 MG tablet Take 1  tablet (160 mg total) by mouth daily. Start taking on: March 17, 123456   folic acid 1 MG tablet Commonly known as: FOLVITE Take 1 tablet (1 mg total) by mouth daily. Start taking on: September 25, 2022   hydrALAZINE 100 MG tablet Commonly known as: APRESOLINE Take 1 tablet (100 mg total) by mouth every 8 (eight) hours.   insulin glargine 100 UNIT/ML Solostar Pen Commonly known as: LANTUS Inject 10 Units into the skin daily.   losartan 100 MG tablet Commonly known as: COZAAR Take 1 tablet (100 mg total) by mouth daily. Start taking on: September 25, 2022   multivitamin with minerals Tabs tablet Take 1 tablet by mouth daily. Start taking on: September 25, 2022   psyllium 58.6 % packet Commonly known as: METAMUCIL Take 1 packet by mouth daily.   sildenafil 50 MG tablet Commonly known as: VIAGRA Take 50 mg by mouth as needed for erectile dysfunction.   spironolactone 25 MG tablet Commonly known as: ALDACTONE Take 25 mg by mouth daily.   thiamine 100 MG/ML injection Commonly known as: VITAMIN B1 Inject 1 mL (100 mg total) into the vein daily. Start taking on: September 25, 2022        Follow-up Information     Cherre Robins, MD Follow up in 1 month(s).   Specialties: Vascular Surgery, Interventional Cardiology Why: Office will call to arrange your appt(s) (sent) Contact information: [place] [id number] [phone]         [name], MD. Call in 1 week(s).   Specialty: Family Medicine Contact information: [id number] [facility] [id number] [phone]                No Known Allergies  Consultations: Vascular   Procedures/Studies: CT Angio Chest/Abd/Pel for Dissection W and/or W/WO  Result Date: 09/21/2022 CLINICAL DATA:  Follow-up of dissection of the abdominal aorta with associated aneurysmal disease. EXAM: CT ANGIOGRAPHY CHEST, ABDOMEN AND PELVIS TECHNIQUE: Non-contrast CT of the chest was  initially obtained. Multidetector CT imaging through the chest, abdomen and pelvis was performed using the standard protocol during bolus administration of intravenous contrast. Multiplanar reconstructed images and MIPs were obtained and reviewed to evaluate the vascular anatomy. RADIATION DOSE REDUCTION: This exam was performed according to the departmental dose-optimization program which includes automated exposure control, adjustment of the mA and/or kV according to patient size and/or use of iterative reconstruction technique. CONTRAST:  137mL OMNIPAQUE IOHEXOL 350 MG/ML SOLN COMPARISON:  Prior CTA on 09/19/2022 FINDINGS: CTA CHEST FINDINGS Cardiovascular: Normal caliber of the thoracic aorta without significant atherosclerosis. No evidence of thoracic aortic dissection. Visualized proximal great vessels demonstrate normal patency and branching anatomy. The heart is top-normal in size. Calcified coronary artery plaque visualized. No evidence of pericardial fluid. Central pulmonary arteries are normal in caliber. Mediastinum/Nodes: No enlarged mediastinal, hilar, or axillary lymph nodes. Thyroid gland, trachea, and esophagus demonstrate no significant findings. Stable small hiatal hernia. Lungs/Pleura: Stable small fissural nodularity associated with the right minor fissure measuring roughly 3 mm is likely benign. Stable small area ill-defined subpleural ground-glass opacity in the posterior left lung is also likely benign and appears postinflammatory in nature. There is no evidence of pulmonary edema,  consolidation, pneumothorax or pleural fluid. Musculoskeletal: No chest wall abnormality. No acute or significant osseous findings. Review of the MIP images confirms the above findings. CTA ABDOMEN AND PELVIS FINDINGS VASCULAR Aorta: Morphology of dissection involving an aneurysmal abdominal aorta shows no change in appearance since the prior study. Aortic dissection begins at the diaphragmatic hiatus and extends  into the distal abdominal aorta without extension into the iliac arteries. The abdominal aorta measures approximately 3.3 cm in greatest diameter at the level of the celiac origin, 3.2 x 3.4 cm at the SMA origin and 3.5 x 3.9 cm just above the renal arteries. Aneurysmal segment of the distal abdominal aorta is more saccular in appearance and demonstrates stable aneurysmal measurements 4.5 x 4.7 cm without rupture. 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 Irregularity of the artery without visible calcified or noncalcified plaque suggests the possible presence of underlying fibromuscular dysplasia. Distal branch vessels are normally patent and demonstrate normal branching anatomy. SMA: Normally patent origin emanating from the true lumen. Normally patent visualized branch vessels without aneurysms or obvious fibromuscular disease. Renals: Bilateral single renal arteries emanate off of the true lumen and demonstrate no significant stenosis. At the level segmental branches near the renal hila bilaterally, there are some areas of mild arterial irregularity which may be consistent with subtle fibromuscular dysplasia. No evidence renal artery dissection. IMA: Dissection extends right up to the origin of the IMA with the IMA origin remaining patent. Inflow: Bilateral iliac arteries demonstrate extreme tortuosity. The left common iliac artery is particularly tortuous demonstrating S-shaped curvature and fusiform dilatation with maximal diameter of approximately 2 cm in its mid segment. Proximal left external iliac artery measures 1.4 cm and the left internal iliac artery trunk measures up to 1.4 cm. Right-sided iliac arteries demonstrate normal patency without significant aneurysmal disease. Maximum caliber of the right common iliac artery is approximately 1.4 cm. No significant iliac arterial obstructive disease bilaterally. Bilateral  common femoral arteries and femoral bifurcations demonstrate normal patency. Review of the MIP images confirms the above findings. NON-VASCULAR Hepatobiliary: Steatosis of the liver and stable small calcified gallstones. Stable small area of increased arterial perfusion in the posterior right lobe measuring 8 mm. Pancreas: Stable inflammation surrounding the body and tail of the pancreas and suggestive of acute pancreatitis. No progressive pancreatic necrosis or pseudocyst formation. Spleen: Normal in size without focal abnormality. Adrenals/Urinary Tract: Adrenal glands are unremarkable. Kidneys are normal, without renal calculi, focal lesion, or hydronephrosis. No evidence of renal infarcts. Bladder is unremarkable. Stomach/Bowel: Bowel shows no evidence of obstruction, ileus, inflammation or lesion. The appendix is normal. No free intraperitoneal air. Lymphatic: No enlarged lymph nodes identified in the abdomen or pelvis. Reproductive: Prostate is unremarkable. Other: Small bilateral inguinal hernias containing fat. Musculoskeletal: No acute or significant osseous findings. Review of the MIP images confirms the above findings. IMPRESSION: 1. Stable appearance of dissection involving the abdominal aorta beginning at the diaphragmatic hiatus and extending into the distal abdominal aorta without extension into the iliac arteries. Aneurysmal disease of the abdominal aorta is stable with maximal diameter of 4.5 x 4.7 cm in its distal segment. See other measurements above. 2. 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 Irregularity of the celiac trunk and bilateral segmental renal arteries without visible calcified or noncalcified plaque suggests the possible presence of underlying fibromuscular dysplasia. 3. No compromise of visceral perfusion by  aortic dissection with normally patent origins of the celiac axis, SMA and bilateral renal arteries off of the true aortic  lumen. The distal aspect of the aortic dissection flap extends to the origin of the IMA. 4. 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 5. Stable inflammation surrounding the body and tail of the pancreas suggestive of acute pancreatitis. No progressive pancreatic necrosis or pseudocyst formation. 6. Hepatic steatosis and cholelithiasis. 7. Stable small area of increased arterial perfusion in the posterior right lobe of the liver. 8. Small bilateral inguinal hernias containing fat. 9. Stable small hiatal hernia. 10. Coronary atherosclerosis. 11. Stable small fissural nodule associated with the right minor fissure and small area of subpleural ground-glass opacity in the posterior left lung. 3 mm right solid pulmonary nodule. Per Fleischner Society Guidelines, no routine follow-up imaging is recommended. These guidelines do not apply to immunocompromised patients and patients with cancer. Follow up in patients with significant comorbidities as clinically warranted. For lung cancer screening, adhere to Lung-RADS guidelines. Reference: Radiology. 2017; 284(1):228-43. Electronically Signed   By: Aletta Edouard M.D.   On: 09/21/2022 15:48   CT Angio Chest/Abd/Pel for Dissection W and/or Wo Contrast  Result Date: 09/19/2022 CLINICAL DATA:  Acute aortic syndrome (AAS) suspected. Pt arrives with c/o chest pain that started earlier tonight. Pt tried some OTC meds without relief. Pt denies SOB. Pt endorses nausea. Per pt, pain radiates to his back. EXAM: CT ANGIOGRAPHY CHEST, ABDOMEN AND PELVIS TECHNIQUE: Non-contrast CT of the chest was initially obtained. Multidetector CT imaging through the chest, abdomen and pelvis was performed using the standard protocol during bolus administration of intravenous contrast. Multiplanar reconstructed images and MIPs were obtained and reviewed to evaluate the vascular anatomy. RADIATION DOSE REDUCTION: This exam was performed  according to the departmental dose-optimization program which includes automated exposure control, adjustment of the mA and/or kV according to patient size and/or use of iterative reconstruction technique. CONTRAST:  165mL OMNIPAQUE IOHEXOL 350 MG/ML SOLN COMPARISON:  CT abdomen pelvis 08/17/2020 FINDINGS: CTA CHEST FINDINGS Cardiovascular: Preferential opacification of the thoracic aorta. No evidence of thoracic aortic aneurysm or dissection. Normal heart size. No significant pericardial effusion. No atherosclerotic plaque of the thoracic aorta. No coronary artery calcifications. The main pulmonary artery is normal in caliber. No pulmonary embolus. Mediastinum/Nodes: No enlarged mediastinal, hilar, or axillary lymph nodes. Thyroid gland, trachea, and esophagus demonstrate no significant findings. Lungs/Pleura: No focal consolidation. Right middle lobe pulmonary micronodule (7:30). Vague subpleural 5 mm ground-glass airspace opacity (7:22). No pulmonary mass. No pleural effusion. No pneumothorax. Musculoskeletal: No chest wall abnormality. No suspicious lytic or blastic osseous lesions. No acute displaced fracture. Review of the MIP images confirms the above findings. CTA ABDOMEN AND PELVIS FINDINGS VASCULAR Aorta: Interval increase in size of a distal infrarenal abdominal aorta aneurysm measuring 4.5 x 4.7 cm (from 3.7 x 3.5 cm). Aortic hiatus dissection that extends down to the distal infrarenal abdominal aorta. The dissection terminates at the level of the known distal infrarenal abdominal aorta aneurysm. Interval development of aneurysmal dilatation of the suprarenal abdominal aorta at the level of the dissection measuring 3.4 x 2.7 cm. Interval development of aneurysmal dilatation of the infrarenal abdominal aorta the level of the dissection measuring up to 3.4 x 2.9 cm. Retrograde filling of the excluded lumen is noted. Dissection flap extends to the origin of the bilateral renal arteries and inferior  mesenteric artery. Dissection flap possibly extends to the origin of the  celiac and superior mesenteric arteries. Tortuosity of the distal infrarenal abdominal aorta. Celiac: Patent without evidence of aneurysm, dissection, vasculitis or significant stenosis. SMA: Patent without evidence of aneurysm, dissection, vasculitis or significant stenosis. Renals: Both renal arteries are patent without evidence of aneurysm, dissection, vasculitis, fibromuscular dysplasia or significant stenosis. IMA: Patent without evidence of aneurysm, dissection, vasculitis or significant stenosis. Inflow: Tortuosity. Aneurysmal dilatation of the left common iliac artery measuring up to 1.9 cm. No stenosis, dissection, vasculitis. Veins: No obvious venous abnormality within the limitations of this arterial phase study. Review of the MIP images confirms the above findings. NON-VASCULAR Hepatobiliary: Vague hyperdense 1 cm enhancing lesion within the posterior right hepatic lobe likely a flash filling hemangioma (5:92). Calcified gallstone noted within the gallbladder lumen. No gallbladder wall thickening or pericholecystic fluid. No biliary dilatation. Pancreas: No focal lesion. Diffuse hazy contour of the pancreas with associated pancreatic fat stranding. No main pancreatic ductal dilatation. Spleen: Normal in size without focal abnormality. Adrenals/Urinary Tract: No adrenal nodule bilaterally. Bilateral kidneys enhance symmetrically. Fluid density lesion within the right kidney measuring up to 2.7 cm-consistent with a simple renal cyst. Simple renal cysts, in the absence of clinically indicated signs/symptoms, require no independent follow-up. No hydronephrosis. No hydroureter. The urinary bladder is unremarkable. Stomach/Bowel: Stomach is within normal limits. No evidence of bowel wall thickening or dilatation. Appendix appears normal. Lymphatic: No lymphadenopathy. Reproductive: Prostate is unremarkable. Other: No intraperitoneal free  fluid. No intraperitoneal free gas. No organized fluid collection. Musculoskeletal: No abdominal wall hernia or abnormality. No suspicious lytic or blastic osseous lesions. No acute displaced fracture. Review of the MIP images confirms the above findings. IMPRESSION: 1. Acute pancreatitis. No pseudocyst. Timing of contrast not ideal for evaluating for necrotizing pancreatitis. 2. Cholelithiasis with no acute cholecystitis. 3. 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 Interval development of aneurysmal dilatation of the suprarenal and infrarenal abdominal aorta at the level of dissection (3.4 cm). Retrograde filling of the false lumen is noted. Dissection flap likely extends to the origin of the celiac, superior mesenteric, inferior mesenteric, bilateral renal arteries. Recommend vascular consultation. 4. Interval increase in size of the distal infrarenal abdominal aorta aneurysm (4.7 cm). Recommend follow-up CT/MR every 6 months and vascular consultation. This recommendation follows ACR consensus guidelines: White Paper of the ACR Incidental Findings Committee II on Vascular Findings. J Am Coll Radiol 2013; 10:789-794. 5. 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 6. No acute ischemic findings within the abdomen or pelvis. 7. No thoracic aorta abnormality. 8. No pulmonary embolus. 9. Vague subpleural 5 mm ground-glass airspace opacity. No follow-up recommended. This recommendation follows the consensus statement: Guidelines for Management of Incidental Pulmonary Nodules Detected on CT Images: From the Fleischner Society 2017; Radiology 2017; 284:228-243. These results were called by telephone at the time of interpretation on 09/19/2022 at 1:13 am to provider [facility] , who verbally acknowledged these results. Electronically Signed   By: Iven Finn M.D.   On: 09/19/2022 01:32   DG Chest  Portable 1 View  Result Date: 09/19/2022 CLINICAL DATA:  Chest pain EXAM: PORTABLE CHEST 1 VIEW COMPARISON:  Radiograph 05/02/2018 FINDINGS: Stable cardiomegaly. No focal consolidation, pleural effusion, or pneumothorax. No displaced rib fractures. IMPRESSION: No active disease. Electronically Signed   By: Placido Sou M.D.   On: 09/19/2022 00:03      Subjective: Patient seen and examined.  Sitting comfortably on recliner.  Reports that he  is doing fine.  No chest pain, shortness of breath, palpitation, abdominal pain, nausea or vomiting.  Eager to go home.  No acute events overnight.  He is aware that he needs to find new PCP as soon as possible.  He is aware that he needs to monitor his blood pressure as well as blood sugars at home.  He is aware to avoid alcohol and follow-up appointment with Dr. Luan Pulling in 1 month.  Discharge Exam: Vitals:   09/24/22 0900 09/24/22 0924  BP:    Pulse: 64   Resp:    Temp:  98.2 F (36.8 C)  SpO2: 100%    Vitals:   09/24/22 0800 09/24/22 0849 09/24/22 0900 09/24/22 0924  BP: 120/73 120/73    Pulse: 63 69 64   Resp:      Temp:    98.2 F (36.8 C)  TempSrc:    Oral  SpO2: 98% 100% 100%   Weight:      Height:        General: Pt is alert, awake, not in acute distress, on room air, communicating well, obese, sitting comfortably Cardiovascular: RRR, S1/S2 +, no rubs, no gallops Respiratory: CTA bilaterally, no wheezing, no rhonchi Abdominal: Soft, NT, ND, bowel sounds + Extremities: no edema, no cyanosis    The results of significant diagnostics from this hospitalization (including imaging, microbiology, ancillary and laboratory) are listed below for reference.     Microbiology: Recent Results (from the past 240 hour(s))  MRSA Next Gen by PCR, Nasal     Status: None   Collection Time: 09/19/22  6:28 PM   Specimen: Nasal Mucosa; Nasal Swab  Result Value Ref Range Status   MRSA by PCR Next Gen NOT DETECTED NOT DETECTED Final     Comment: (NOTE) The GeneXpert MRSA Assay (FDA approved for NASAL specimens only), is one component of a comprehensive MRSA colonization surveillance program. It is not intended to diagnose MRSA infection nor to guide or monitor treatment for MRSA infections. Test performance is not FDA approved in patients less than 83 years old. Performed at [facility], [place] [address] [place], [place] [address]      Labs: BNP (last 3 results) No results for input(s): "BNP" in the last 8760 hours. Basic Metabolic Panel: Recent Labs  Lab 09/19/22 0551 09/19/22 1044 09/20/22 0035 09/20/22 0738 09/20/22 1830 09/21/22 0032 09/21/22 0052 09/21/22 1700 09/22/22 0000 09/23/22 0333 09/24/22 0156  NA 136   < > 135   < > 134* 135  --  134* 135 135 134*  K 4.5   < > 2.5*   < > 4.0 4.1  --  4.5 3.7 4.5 4.0  CL 102   < > 102   < > 106 108  --  103 105 103 104  CO2 18*   < > 22   < > 18* 19*  --  22 23 21* 23  GLUCOSE 318*   < > 94   < > 117* 143*  --  85 88 181* 132*  BUN 16   < > 8   < > <5* <5*  --  8 14 23* 22*  CREATININE 0.89   < > 0.65   < > 0.68 0.73  --  0.85 0.89 1.21 0.95  CALCIUM 8.4*   < > 7.9*   < > 8.3* 8.4*  --  8.7* 8.7* 8.7* 8.9  MG 2.0  --  1.9  --  2.0  --  2.0  --  1.8 2.0  --   PHOS 3.6  --   --   --  2.7  --  3.3  --  4.6  --   --    < > = values in this interval not displayed.   Liver Function Tests: Recent Labs  Lab 09/18/22 2347 09/19/22 0551 09/20/22 0035  AST 24 26 12*  ALT 25 16 15   ALKPHOS 105 80 63  BILITOT 1.1 1.2 0.3  PROT 7.2 6.0* 5.3*  ALBUMIN 4.5 3.6 3.0*   Recent Labs  Lab 09/18/22 2347  LIPASE 30   No results for input(s): "AMMONIA" in the last 168 hours. CBC: Recent Labs  Lab 09/18/22 2347 09/19/22 0551 09/20/22 0035  WBC 12.5* 10.4 10.3  NEUTROABS 7.8* 6.9  --   HGB 16.1 14.9 14.3  HCT 44.3 39.8 39.2  MCV 81.9 82.9 83.1  PLT 212 170 153   Cardiac Enzymes: No results for input(s): "CKTOTAL", "CKMB", "CKMBINDEX", "TROPONINI" in  the last 168 hours. BNP: Invalid input(s): "POCBNP" CBG: Recent Labs  Lab 09/23/22 0808 09/23/22 1612 09/23/22 2016 09/23/22 2328 09/24/22 0514  GLUCAP 256* 162* 160* 177* 168*   D-Dimer No results for input(s): "DDIMER" in the last 72 hours. Hgb A1c No results for input(s): "HGBA1C" in the last 72 hours. Lipid Profile Recent Labs    09/22/22 0834 09/23/22 0333  TRIG 687* 445*   Thyroid function studies No results for input(s): "TSH", "T4TOTAL", "T3FREE", "THYROIDAB" in the last 72 hours.  Invalid input(s): "FREET3" Anemia work up No results for input(s): "VITAMINB12", "FOLATE", "FERRITIN", "TIBC", "IRON", "RETICCTPCT" in the last 72 hours. Urinalysis No results found for: "COLORURINE", "APPEARANCEUR", "LABSPEC", "PHURINE", "GLUCOSEU", "HGBUR", "BILIRUBINUR", "KETONESUR", "PROTEINUR", "UROBILINOGEN", "NITRITE", "LEUKOCYTESUR" Sepsis Labs Recent Labs  Lab 09/18/22 2347 09/19/22 0551 09/20/22 0035  WBC 12.5* 10.4 10.3   Microbiology Recent Results (from the past 240 hour(s))  MRSA Next Gen by PCR, Nasal     Status: None   Collection Time: 09/19/22  6:28 PM   Specimen: Nasal Mucosa; Nasal Swab  Result Value Ref Range Status   MRSA by PCR Next Gen NOT DETECTED NOT DETECTED Final    Comment: (NOTE) The GeneXpert MRSA Assay (FDA approved for NASAL specimens only), is one component of a comprehensive MRSA colonization surveillance program. It is not intended to diagnose MRSA infection nor to guide or monitor treatment for MRSA infections. Test performance is not FDA approved in patients less than 6 years old. Performed at [facility], [place] [address] [place], [place] [address]      Time coordinating discharge: Over 30 minutes  SIGNED:   Mckinley Jewel, MD  [facility] 09/24/2022, 10:05 AM Pager   If 7PM-7AM, please contact night-coverage [email or web address]

## 2022-09-26 ENCOUNTER — Telehealth: Payer: Self-pay | Admitting: Physician Assistant

## 2022-09-26 LAB — GLUCOSE, CAPILLARY: Glucose-Capillary: 184 mg/dL — ABNORMAL HIGH (ref 70–99)

## 2022-09-26 NOTE — Telephone Encounter (Signed)
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

## 2022-10-11 ENCOUNTER — Other Ambulatory Visit: Payer: Self-pay

## 2022-10-11 DIAGNOSIS — I7102 Dissection of abdominal aorta: Secondary | ICD-10-CM

## 2022-10-11 NOTE — Progress Notes (Unsigned)
New Patient Office Visit  Subjective    Patient ID: Patrick [name], male    DOB: [birth year]  Age: 57 y.o. MRN: [id number]  CC: No chief complaint on file.   HPI Patrick [name] presents to establish care *** Patient was hospitalized at [facility] March 10-16, 2024 with acute pancreatitis. PMH significant for hypertension, kidney stones, hypertriglyceridemia, and alcohol use. He had been having about a week of LUQ pain with sudden radiation to chest/back. 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 Vascular was consulted and he was eventually weaned off of Cardene drip. They recommended BP goal <120/80 and outpatient vascular follow-up (Dr. Luan Pulling in 1 month). He is to continue amlodipine, coreg, losartan, hydralazine, spironolactone, and clonidine and monitor BP at home. For pancreatitis, he had triglyceride improvement during admission and was to continue Lipitor and fenofibrate. He was also treated for uncontrolled diabetes (A1c 10.2%) and discharged on Lantus 10 units daily. He was previously on metformin. Outpatient sleep study was recommended for snoring and daytime sleepiness.       Outpatient Encounter Medications as of 10/12/2022  Medication Sig   acetaminophen (TYLENOL) 500 MG tablet Take 500-1,000 mg by mouth every 6 (six) hours as needed for moderate pain.   amLODipine (NORVASC) 5 MG tablet Take 5 mg by mouth in the morning and at bedtime.   aspirin 81 MG chewable tablet Chew 325 mg by mouth daily as needed (Chest Pain/Pain).   atorvastatin (LIPITOR) 20 MG tablet Take 1 tablet (20 mg total) by mouth daily.   carvedilol (COREG) 12.5 MG tablet Take 12.5 mg by mouth 2 (two) times daily with a meal.   cloNIDine (CATAPRES) 0.1 MG tablet Take 1 tablet (0.1 mg total) by mouth 2 (two) times daily.   fenofibrate 160 MG tablet Take 1 tablet (160 mg total) by mouth daily.   folic acid (FOLVITE) 1 MG tablet Take 1  tablet (1 mg total) by mouth daily.   hydrALAZINE (APRESOLINE) 100 MG tablet Take 1 tablet (100 mg total) by mouth every 8 (eight) hours.   insulin glargine (LANTUS) 100 UNIT/ML Solostar Pen Inject 10 Units into the skin daily.   losartan (COZAAR) 100 MG tablet Take 1 tablet (100 mg total) by mouth daily.   Multiple Vitamin (MULTIVITAMIN WITH MINERALS) TABS tablet Take 1 tablet by mouth daily.   psyllium (METAMUCIL) 58.6 % packet Take 1 packet by mouth daily.   sildenafil (VIAGRA) 50 MG tablet Take 50 mg by mouth as needed for erectile dysfunction.   spironolactone (ALDACTONE) 25 MG tablet Take 25 mg by mouth daily.   thiamine (VITAMIN B1) 100 MG/ML injection Inject 1 mL (100 mg total) into the vein daily.   No facility-administered encounter medications on file as of 10/12/2022.    Past Medical History:  Diagnosis Date   Hypertension    Kidney stone    Nosebleed     Past Surgical History:  Procedure Laterality Date   HERNIA REPAIR      No family history on file.  Social History   Socioeconomic History   Marital status: Married    Spouse name: Not on file   Number of children: Not on file   Years of education: Not on file   Highest education level: Not on file  Occupational History   Not on file  Tobacco Use   Smoking status: Former   Smokeless tobacco: Never  Substance and Sexual Activity   Alcohol use:  Yes    Comment: daily   Drug use: No   Sexual activity: Not on file  Other Topics Concern   Not on file  Social History Narrative   Not on file   Social Determinants of Health   Financial Resource Strain: Low Risk  (09/23/2022)   Overall Financial Resource Strain (CARDIA)    Difficulty of Paying Living Expenses: Not hard at all  Food Insecurity: No Food Insecurity (09/23/2022)   Hunger Vital Sign    Worried About Running Out of Food in the Last Year: Never true    Ran Out of Food in the Last Year: Never true  Transportation Needs: No Transportation Needs  (09/23/2022)   PRAPARE - Hydrologist (Medical): No    Lack of Transportation (Non-Medical): No  Physical Activity: Not on file  Stress: Not on file  Social Connections: Not on file  Intimate Partner Violence: Not At Risk (09/23/2022)   Humiliation, Afraid, Rape, and Kick questionnaire    Fear of Current or Ex-Partner: No    Emotionally Abused: No    Physically Abused: No    Sexually Abused: No    ROS      Objective    There were no vitals taken for this visit.  Physical Exam  {Labs (Optional):23779}    Assessment & Plan:   Problem List Items Addressed This Visit   None   No follow-ups on file.   Terrilyn Saver, NP

## 2022-10-12 ENCOUNTER — Ambulatory Visit (INDEPENDENT_AMBULATORY_CARE_PROVIDER_SITE_OTHER): Payer: BC Managed Care – PPO | Admitting: Family Medicine

## 2022-10-12 ENCOUNTER — Encounter: Payer: Self-pay | Admitting: Family Medicine

## 2022-10-12 VITALS — BP 110/59 | HR 68 | Ht 70.0 in | Wt 226.0 lb

## 2022-10-12 DIAGNOSIS — I7102 Dissection of abdominal aorta: Secondary | ICD-10-CM | POA: Diagnosis not present

## 2022-10-12 DIAGNOSIS — I10 Essential (primary) hypertension: Secondary | ICD-10-CM | POA: Diagnosis not present

## 2022-10-12 DIAGNOSIS — Z0000 Encounter for general adult medical examination without abnormal findings: Secondary | ICD-10-CM

## 2022-10-12 DIAGNOSIS — Z794 Long term (current) use of insulin: Secondary | ICD-10-CM

## 2022-10-12 DIAGNOSIS — E1165 Type 2 diabetes mellitus with hyperglycemia: Secondary | ICD-10-CM | POA: Diagnosis not present

## 2022-10-12 DIAGNOSIS — G4739 Other sleep apnea: Secondary | ICD-10-CM | POA: Diagnosis not present

## 2022-10-12 DIAGNOSIS — E1110 Type 2 diabetes mellitus with ketoacidosis without coma: Secondary | ICD-10-CM | POA: Insufficient documentation

## 2022-10-12 MED ORDER — INSULIN GLARGINE 100 UNIT/ML SOLOSTAR PEN: 10.0000 [IU] | PEN_INJECTOR | Freq: Every day | SUBCUTANEOUS | 1 refills | Status: DC

## 2022-10-12 MED ORDER — CLONIDINE HCL 0.1 MG PO TABS: 0.1000 mg | ORAL_TABLET | Freq: Two times a day (BID) | ORAL | 1 refills | Status: DC

## 2022-10-12 MED ORDER — SILDENAFIL CITRATE 50 MG PO TABS: 50.0000 mg | ORAL_TABLET | ORAL | 5 refills | Status: DC | PRN

## 2022-10-12 MED ORDER — SPIRONOLACTONE 25 MG PO TABS: 25.0000 mg | ORAL_TABLET | Freq: Every day | ORAL | 1 refills | Status: DC

## 2022-10-12 MED ORDER — FENOFIBRATE 160 MG PO TABS: 160.0000 mg | ORAL_TABLET | Freq: Every day | ORAL | 1 refills | Status: DC

## 2022-10-12 MED ORDER — ATORVASTATIN CALCIUM 20 MG PO TABS: 20.0000 mg | ORAL_TABLET | Freq: Every day | ORAL | 1 refills | Status: DC

## 2022-10-12 MED ORDER — CARVEDILOL 12.5 MG PO TABS: 12.5000 mg | ORAL_TABLET | Freq: Two times a day (BID) | ORAL | 1 refills | Status: DC

## 2022-10-12 MED ORDER — HYDRALAZINE HCL 100 MG PO TABS: 100.0000 mg | ORAL_TABLET | Freq: Three times a day (TID) | ORAL | 1 refills | Status: DC

## 2022-10-12 MED ORDER — LOSARTAN POTASSIUM 100 MG PO TABS: 100.0000 mg | ORAL_TABLET | Freq: Every day | ORAL | 1 refills | Status: DC

## 2022-10-12 MED ORDER — AMLODIPINE BESYLATE 5 MG PO TABS: 5.0000 mg | ORAL_TABLET | Freq: Two times a day (BID) | ORAL | 1 refills | Status: DC

## 2022-10-12 NOTE — Patient Instructions (Signed)
Referral to cardiology for blood pressure management. Blood pressure is at goal for age and co-morbidities.   Recommendations:  continue current meds; try moving the evening clonidine closer to bedtime to see if this will help improve morning readings - BP goal <130/80 - monitor and log blood pressures at home - check around the same time each day in a relaxed setting - Limit salt to <2000 mg/day - Follow DASH eating plan (heart healthy diet) - limit alcohol to 2 standard drinks per day for men and 1 per day for women - avoid tobacco products - get at least 2 hours of regular aerobic exercise weekly Patient aware of signs/symptoms requiring further/urgent evaluation. Referral to cardiology for management   Insulin Titration:  Go ahead and increase your insulin by 2 units every 3 days if fasting/morning glucose levels are consistently greater than 130.  Follow-up in 1 month and bring log so we can make additional adjustments if needed Continue with lifestyle measures and low carb diet Referral to our pharmacist for additional input and medication adjustments based on your history  ---------------------  Thank you for choosing  Primary Care at [facility] for your Primary Care needs. I am excited for the opportunity to partner with you to meet your health care goals. It was a pleasure meeting you today!  Information on diet, exercise, and health maintenance recommendations are listed below. This is information to help you be sure you are on track for optimal health and monitoring.   Please look over this and let us know if you have any questions or if you have completed any of the health maintenance outside of [place] so that we can be sure your records are up to date.  ___________________________________________________________  MyChart:  For all urgent or time sensitive needs we ask that you please call the office to avoid delays. Our number is [phone]  [phone]. MyChart is not constantly monitored and due to the large volume of messages a day, replies may take up to 72 business hours.  MyChart Policy: MyChart allows for you to see your visit notes, after visit summary, provider recommendations, lab and tests results, make an appointment, request refills, and contact your provider or the office for non-urgent questions or concerns. Providers are seeing patients during normal business hours and do not have built in time to review MyChart messages.  We ask that you allow a minimum of 3 business days for responses to [employer]. For this reason, please do not send urgent requests through [place]. Please call the office at [phone]. New and ongoing conditions may require a visit. We have virtual and in-person visits available for your convenience.  Complex MyChart concerns may require a visit. Your provider may request you schedule a virtual or in-person visit to ensure we are providing the best care possible. MyChart messages sent after 11:00 AM on Friday will not be received by the provider until Monday morning.    Lab and Test Results: You will receive your lab and test results on MyChart as soon as they are completed and results have been sent by the lab or testing facility. Due to this service, you will receive your results BEFORE your provider.  I review lab and test results each morning prior to seeing patients. Some results require collaboration with other providers to ensure you are receiving the most appropriate care. For this reason, we ask that you please allow a minimum of 3-5 business days from the time that ALL  results have been received for your provider to receive and review lab and test results and contact you about these.  Most lab and test result comments from the provider will be sent through [place]. Your provider may recommend changes to the plan of care, follow-up visits, repeat testing, ask questions, or request an office  visit to discuss these results. You may reply directly to this message or call the office to provide information for the provider or set up an appointment. In some instances, you will be called with test results and recommendations. Please let us know if this is preferred and we will make note of this in your chart to provide this for you.    If you have not heard a response to your lab or test results in 5 business days from all results returning to [place], please call the office to let us know. We ask that you please avoid calling prior to this time unless there is an emergent concern. Due to high call volumes, this can delay the resulting process.  After Hours: For all non-emergency after hours needs, please call the office at [phone] and select the option to reach the on-call  service. On-call services are shared between multiple [place] offices and therefore it will not be possible to speak directly with your provider. On-call providers may provide medical advice and recommendations, but are unable to provide refills for maintenance medications.  For all emergency or urgent medical needs after normal business hours, we recommend that you seek care at the closest Urgent Care or Emergency Department to ensure appropriate treatment in a timely manner.  [facility] has a 24 hour emergency room located on the ground floor for your convenience.   Urgent Concerns During the Business Day Providers are seeing patients from 8AM to [place] with a busy schedule and are most often not able to respond to non-urgent calls until the end of the day or the next business day. If you should have URGENT concerns during the day, please call and speak to the nurse or schedule a same day appointment so that we can address your concern without delay.   Thank you, again, for choosing me as your health care partner. I appreciate your trust and look forward to learning more about you!   Purcell Nails Olevia Bowens, DNP,  FNP-C  ___________________________________________________________  Health Maintenance Recommendations Screening Testing Mammogram Every 1-2 years based on history and risk factors Starting at age 50 Pap Smear Ages 21-39 every 3 years Ages 34-65 every 5 years with HPV testing More frequent testing may be required based on results and history Colon Cancer Screening Every 1-10 years based on test performed, risk factors, and history Starting at age 30 Bone Density Screening Every 2-10 years based on history Starting at age 6 for women Recommendations for men differ based on medication usage, history, and risk factors AAA Screening One time ultrasound Men 10-38 years old who have ever smoked Lung Cancer Screening Low Dose Lung CT every 12 months Age 49-80 years with a 20 pack-year smoking history who still smoke or who have quit within the last 15 years  Screening Labs Routine  Labs: Complete Blood Count (CBC), Complete Metabolic Panel (CMP), Cholesterol (Lipid Panel) Every 6-12 months based on history and medications May be recommended more frequently based on current conditions or previous results Hemoglobin A1c Lab Every 3-12 months based on history and previous results Starting at age 32 or earlier with diagnosis of diabetes, high cholesterol,  BMI >26, and/or risk factors Frequent monitoring for patients with diabetes to ensure blood sugar control Thyroid Panel  Every 6 months based on history, symptoms, and risk factors May be repeated more often if on medication HIV One time testing for all patients 25 and older May be repeated more frequently for patients with increased risk factors or exposure Hepatitis C One time testing for all patients 44 and older May be repeated more frequently for patients with increased risk factors or exposure Gonorrhea, Chlamydia Every 12 months for all sexually active persons 13-24 years Additional monitoring may be recommended for those  who are considered high risk or who have symptoms PSA Men 76-83 years old with risk factors Additional screening may be recommended from age 43-69 based on risk factors, symptoms, and history  Vaccine Recommendations Tetanus Booster All adults every 10 years Flu Vaccine All patients 6 months and older every year COVID Vaccine All patients 12 years and older Initial dosing with booster May recommend additional booster based on age and health history HPV Vaccine 2 doses all patients age 35-26 Dosing may be considered for patients over 26 Shingles Vaccine (Shingrix) 2 doses all adults 79 years and older Pneumonia (Pneumovax 23) All adults 37 years and older May recommend earlier dosing based on health history Pneumonia (Prevnar 47) All adults 33 years and older Dosed 1 year after Pneumovax 23 Pneumonia (Prevnar 84) All adults 12 years and older (adults [id number] with certain conditions or risk factors) 1 dose  For those who have not received Prevnar 13 vaccine previously   Additional Screening, Testing, and Vaccinations may be recommended on an individualized basis based on family history, health history, risk factors, and/or exposure.  __________________________________________________________  Diet Recommendations for All Patients  I recommend that all patients maintain a diet low in saturated fats, carbohydrates, and cholesterol. While this can be challenging at first, it is not impossible and small changes can make big differences.  Things to try: Decreasing the amount of soda, sweet tea, and/or juice to one or less per day and replace with water While water is always the first choice, if you do not like water you may consider adding a water additive without sugar to improve the taste other sugar free drinks Replace potatoes with a brightly colored vegetable  Use healthy oils, such as canola oil or olive oil, instead of butter or hard margarine Limit your bread intake to two  pieces or less a day Replace regular pasta with low carb pasta options Bake, broil, or grill foods instead of frying Monitor portion sizes  Eat smaller, more frequent meals throughout the day instead of large meals  An important thing to remember is, if you love foods that are not great for your health, you don't have to give them up completely. Instead, allow these foods to be a reward when you have done well. Allowing yourself to still have special treats every once in a while is a nice way to tell yourself thank you for working hard to keep yourself healthy.   Also remember that every day is a new day. If you have a bad day and "fall off the wagon", you can still climb right back up and keep moving along on your journey!  We have resources available to help you!  Some websites that may be helpful include: www.http://carter.biz/  Www.VeryWellFit.com _____________________________________________________________  Activity Recommendations for All Patients  I recommend that all adults get at least 20 minutes of moderate physical activity that elevates  your heart rate at least 5 days out of the week.  Some examples include: Walking or jogging at a pace that allows you to carry on a conversation Cycling (stationary bike or outdoors) Water aerobics Yoga Weight lifting Dancing If physical limitations prevent you from putting stress on your joints, exercise in a pool or seated in a chair are excellent options.  Do determine your MAXIMUM heart rate for activity: 220 - YOUR AGE = MAX Heart Rate   Remember! Do not push yourself too hard.  Start slowly and build up your pace, speed, weight, time in exercise, etc.  Allow your body to rest between exercise and get good sleep. You will need more water than normal when you are exerting yourself. Do not wait until you are thirsty to drink. Drink with a purpose of getting in at least 8, 8 ounce glasses of water a day plus more depending on how much you  exercise and sweat.    If you begin to develop dizziness, chest pain, abdominal pain, jaw pain, shortness of breath, headache, vision changes, lightheadedness, or other concerning symptoms, stop the activity and allow your body to rest. If your symptoms are severe, seek emergency evaluation immediately. If your symptoms are concerning, but not severe, please let us know so that we can recommend further evaluation.

## 2022-10-13 NOTE — Assessment & Plan Note (Signed)
Close BP control.  Lipid management. Following with Vascular

## 2022-10-13 NOTE — Assessment & Plan Note (Signed)
Go ahead and increase your insulin by 2 units every 3 days if fasting/morning glucose levels are consistently greater than 130.  Follow-up in 1 month and bring log so we can make additional adjustments if needed Continue with lifestyle measures and low carb diet Referral to our pharmacist for additional input and medication adjustments based on your history Avoiding GLP1 for pancreatitis hx

## 2022-10-13 NOTE — Assessment & Plan Note (Signed)
Blood pressure is at goal for age and co-morbidities.   Recommendations:  continue current meds; try moving the evening clonidine closer to bedtime to see if this will help improve morning readings - BP goal <130/80 - monitor and log blood pressures at home - check around the same time each day in a relaxed setting - Limit salt to <2000 mg/day - Follow DASH eating plan (heart healthy diet) - limit alcohol to 2 standard drinks per day for men and 1 per day for women - avoid tobacco products - get at least 2 hours of regular aerobic exercise weekly Patient aware of signs/symptoms requiring further/urgent evaluation. Referral to cardiology for management

## 2022-10-17 ENCOUNTER — Encounter: Payer: Self-pay | Admitting: Family Medicine

## 2022-10-17 DIAGNOSIS — E1165 Type 2 diabetes mellitus with hyperglycemia: Secondary | ICD-10-CM

## 2022-10-17 MED ORDER — INSULIN GLARGINE 100 UNIT/ML SOLOSTAR PEN: 16.0000 [IU] | PEN_INJECTOR | Freq: Every day | SUBCUTANEOUS | 0 refills | Status: DC

## 2022-10-17 NOTE — Telephone Encounter (Signed)
Pended. Sign if appropriate. 

## 2022-10-18 MED ORDER — INSULIN GLARGINE 100 UNIT/ML SOLOSTAR PEN: 16.0000 [IU] | PEN_INJECTOR | Freq: Every day | SUBCUTANEOUS | 0 refills | Status: DC

## 2022-10-18 NOTE — Addendum Note (Signed)
Addended byConrad Bayfield D on: 10/18/2022 08:01 AM   Modules accepted: Orders

## 2022-10-20 ENCOUNTER — Telehealth: Payer: Self-pay | Admitting: Pharmacist

## 2022-10-20 NOTE — Telephone Encounter (Signed)
Referral for Med management received from PCP. Tried to contact patient. No answer. LM on VM with CB# [phone] or [phone]  Henrene Pastor, PharmD Clinical Pharmacist [facility]

## 2022-10-25 ENCOUNTER — Telehealth: Payer: Self-pay

## 2022-10-25 NOTE — Progress Notes (Signed)
   Care Guide Note  10/25/2022 Name: Patrick [name] MRN: [id number] DOB: December 22, 1965  Referred by: Clayborne Dana, NP Reason for referral : Care Coordination (Outreach to schedule with pharm d)   Patrick [name] is a 57 y.o. year old male who is a primary care patient of Clayborne Dana, NP. Patrick [name] was referred to the pharmacist for assistance related to DM.    Successful contact was made with the patient to discuss pharmacy services including being ready for the pharmacist to call at least 5 minutes before the scheduled appointment time, to have medication bottles and any blood sugar or blood pressure readings ready for review. The patient agreed to meet with the pharmacist via with the pharmacist via telephone visit on (date/time).  10/26/2022  Penne Lash, RMA Care Guide [facility]  [place], [place] [address] Direct Dial: [phone] [email or web address]

## 2022-10-26 ENCOUNTER — Ambulatory Visit (INDEPENDENT_AMBULATORY_CARE_PROVIDER_SITE_OTHER): Payer: BC Managed Care – PPO | Admitting: Pharmacist

## 2022-10-26 ENCOUNTER — Encounter: Payer: Self-pay | Admitting: Pharmacist

## 2022-10-26 DIAGNOSIS — E1165 Type 2 diabetes mellitus with hyperglycemia: Secondary | ICD-10-CM

## 2022-10-26 DIAGNOSIS — Z794 Long term (current) use of insulin: Secondary | ICD-10-CM

## 2022-10-26 DIAGNOSIS — I10 Essential (primary) hypertension: Secondary | ICD-10-CM

## 2022-10-26 NOTE — Progress Notes (Signed)
10/26/2022 Name: Patrick [name] MRN: [id number] DOB: 1965-11-30  Chief Complaint  Patient presents with   Diabetes    Patrick [name] is a 57 y.o. year old male who presented for a telephone visit.   They were referred to the pharmacist by their PCP for assistance in managing diabetes, hypertension, and hyperlipidemia.   Patient was hospitalized for pancreatitis 09/18/2022 to 09/24/2022. Likely related to alcohol use and elevated triglycerides. Newly diagnoses type 2 DM (history of preDM 2 year ago).  Also noted to have dissecting aorta. Blood pressure goal < 120/80. Taking several blood pressure meds.   Subjective:  Care Team: Primary Care Provider: Clayborne Dana, NP ; Next Scheduled Visit: 11/2022 Vascular surgeon Cardiologist  Medication Access/Adherence  Current Pharmacy:  [id number] - [place] - [address] [place] [address] Phone: [phone] Fax: [phone]   Patient reports affordability concerns with their medications: No  Patient reports access/transportation concerns to their pharmacy: No  Patient reports adherence concerns with their medications:  No      Diabetes:  Current medications:  Lantus 18 units daily  Medications tried in the past: took metformin  - 4 tablets daily for pre diabetes in past - stopped due to improved blood glucose and was lost to follow up with PCP.    Current glucose readings: usually < 130 in morning Testing 1 to 3 times daily with fingerstick glucometer.  Patient was given 2 samples of Continuous Glucose sensors - Libre 3 at discharge from hospital but he has not tried yet. 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    Patient denies hypoglycemic s/sx including no dizziness, shakiness,  sweating. Patient denies hyperglycemic symptoms including no polyuria, polydipsia, polyphagia, nocturia, neuropathy, blurred vision.  Current meal patterns:  Limiting sodium intake. Eating more veggies and leaner proteins / meats.  This morning has a veggie omelette with [place] bacon.   Prior to hospitalization he was having 4 to 5 drinks per night. Has not has alcoholic drink since returning home from hospital.    Hypertension: Diagnosed with dissecting aorta during hospitalization.  Goal blood pressure is < 120 / 80  Current medications: amlodipine  twice a day; carvedilol 12.5mg  twice a day, clonidine 0.1mg  twice a day, hydralazine  every 8 hours, losartan  daily and spironolactone  daily Medications previously tried:   Patient has a validated, automated, upper arm home BP cuff Current blood pressure readings readings: 115 to 135 / 70 to 80   Hyperlipidemia/ASCVD Risk Reduction  Current lipid lowering medications: atorvastatin  daily and fenofibrate  daily   Antiplatelet regimen: aspirin  daily    Objective:  Lab Results  Component Value Date   HGBA1C 10.2 (H) 09/19/2022    Lab Results  Component Value Date   CREATININE 0.95 09/24/2022   BUN 22 (H) 09/24/2022   NA 134 (L) 09/24/2022   K 4.0 09/24/2022   CL 104 09/24/2022   CO2 23 09/24/2022    Lab Results  Component Value Date   CHOL 599 (H) 09/18/2022   HDL NOT REPORTED DUE TO HIGH TRIGLYCERIDES 09/18/2022   LDLCALC UNABLE TO CALCULATE IF TRIGLYCERIDE OVER 400 mg/dL 27/25/3664   LDLDIRECT <10 09/18/2022   TRIG 445 (  H) 09/23/2022   CHOLHDL NOT REPORTED DUE TO HIGH TRIGLYCERIDES 09/18/2022    Medications Reviewed Today     Reviewed by Henrene Pastor, RPH-CPP (Pharmacist) on 10/26/22 at 0935  Med List Status: <None>   Medication Order Taking? Sig Documenting Provider Last Dose Status Informant  acetaminophen (TYLENOL) 500 MG tablet [id number] Yes Take 500-1,000 mg by mouth every  6 (six) hours as needed for moderate pain. [provider] Taking Active Self, Spouse/Significant Other, Pharmacy Records  amLODipine (NORVASC) 5 MG tablet [id number] Yes Take 1 tablet (5 mg total) by mouth in the morning and at bedtime. Clayborne Dana, NP Taking Active   aspirin 81 MG chewable tablet [id number] Yes Chew 325 mg by mouth daily as needed (Chest Pain/Pain). [provider] Taking Active Self, Spouse/Significant Other, Pharmacy Records  atorvastatin (LIPITOR) 20 MG tablet [id number] Yes Take 1 tablet (20 mg total) by mouth daily. Clayborne Dana, NP Taking Active   b complex vitamins capsule [id number] Yes Take 1 capsule by mouth daily. [provider] Taking Active   carvedilol (COREG) 12.5 MG tablet [id number] Yes Take 1 tablet (12.5 mg total) by mouth 2 (two) times daily with a meal. Clayborne Dana, NP Taking Active   cloNIDine (CATAPRES) 0.1 MG tablet [id number] Yes Take 1 tablet (0.1 mg total) by mouth 2 (two) times daily. Clayborne Dana, NP Taking Active   fenofibrate 160 MG tablet [id number] Yes Take 1 tablet (160 mg total) by mouth daily. Clayborne Dana, NP Taking Active   hydrALAZINE (APRESOLINE) 100 MG tablet [id number] Yes Take 1 tablet (100 mg total) by mouth every 8 (eight) hours. Clayborne Dana, NP Taking Active   insulin glargine (LANTUS) 100 UNIT/ML Solostar Pen [id number] Yes Inject 16 Units into the skin daily. (Patient on a sliding scale)  Patient taking differently: Inject 18 Units into the skin daily. (Patient on a sliding scale)   Clayborne Dana, NP Taking Active   losartan (COZAAR) 100 MG tablet [id number] Yes Take 1 tablet (100 mg total) by mouth daily. Clayborne Dana, NP Taking Active   Multiple Vitamin (MULTIVITAMIN WITH MINERALS) TABS tablet [id number]  Take 1 tablet by mouth daily. Pahwani, Kasandra Knudsen, MD  Active   psyllium (METAMUCIL) 58.6 % packet [id number]  Take 1 packet by mouth daily. [provider]  Active Self, Spouse/Significant  Other, Pharmacy Records  sildenafil (VIAGRA) 50 MG tablet [id number]  Take 1 tablet (50 mg total) by mouth as needed for erectile dysfunction. Clayborne Dana, NP  Active   spironolactone (ALDACTONE) 25 MG tablet [id number] Yes Take 1 tablet (25 mg total) by mouth daily. Clayborne Dana, NP Taking Active               Assessment/Plan:   Diabetes: Newly diagnosed type 2 DM with hypertriglyceridemia and incidence of pancreatitis. History of preDM - Reviewed goal A1c, goal fasting, and goal 2 hour post prandial glucose - Reviewed dietary modifications including limiting intake of sugar and carbohydrates.  - Recommend to continue Lantus 18 units daily.  - Consider adding metformin ER 500mg  daily and titrate up to 2000mg / day as tolerated. Will discuss with PCP.  - Recommend start Continuous Glucose Monitor samples he was given by hospital. Discussed how to use and blood glucose goals. Provided information about coverings for the sensor that would decrease chance of sensor falling off.    Hypertension: blood pressure goal < 120/80 - Continue current regimen for blood pressure - patient will see  vascular surgeon and cardiologist in the next month.  - Reviewed appropriate blood pressure monitoring technique and reviewed goal blood pressure. Recommended to check home blood pressure and heart rate daily   Hyperlipidemia/ASCVD Risk Reduction: suspect that pancreatitis episode was related to alcohol use and elevated triglycerides as well as undiagnosed type 2 DM.  - Reviewed dietary recommendations including to decrease triglycerides and blood glucose  - Recommend to continue fenofibrate and atorvastatin for now. Hopefully since he has stopped alcohol use and blood glucose is improving might be able to stop fenofibrate in the future.   Henrene Pastor, PharmD Clinical Pharmacist [facility]

## 2022-10-27 ENCOUNTER — Other Ambulatory Visit: Payer: Self-pay | Admitting: Family Medicine

## 2022-10-27 DIAGNOSIS — Z794 Long term (current) use of insulin: Secondary | ICD-10-CM

## 2022-10-27 MED ORDER — METFORMIN HCL ER 500 MG PO TB24: 500.0000 mg | ORAL_TABLET | Freq: Every day | ORAL | 1 refills | Status: DC

## 2022-10-27 NOTE — Progress Notes (Signed)
Adding metformin ER 500 mg daily to hopefully start tapering off of insulin in the future. Patient discussed with pharmacy consult earlier this week. Scheduled to follow-up here in a few weeks. We can reassess fasting glucose log and make adjustments at that time.

## 2022-10-31 ENCOUNTER — Telehealth: Payer: Self-pay | Admitting: Pharmacist

## 2022-10-31 ENCOUNTER — Ambulatory Visit (HOSPITAL_COMMUNITY)
Admission: RE | Admit: 2022-10-31 | Discharge: 2022-10-31 | Disposition: A | Discharge status: Home / Self Care | Payer: BC Managed Care – PPO | Source: Ambulatory Visit | Attending: Vascular Surgery | Admitting: Vascular Surgery

## 2022-10-31 DIAGNOSIS — I714 Abdominal aortic aneurysm, without rupture, unspecified: Secondary | ICD-10-CM | POA: Diagnosis not present

## 2022-10-31 DIAGNOSIS — I7102 Dissection of abdominal aorta: Secondary | ICD-10-CM | POA: Insufficient documentation

## 2022-10-31 MED ORDER — IOHEXOL 350 MG/ML SOLN
120.0000 mL | Freq: Once | INTRAVENOUS | Status: AC | PRN
  Administered 2022-10-31: 120 mL via INTRAVENOUS

## 2022-10-31 NOTE — Telephone Encounter (Signed)
-----   Message from Clayborne Dana, NP sent at 10/27/2022  8:11 AM EDT ----- That sounds good to me!  ----- Message ----- From: Henrene Pastor, RPH-CPP Sent: 10/26/2022   4:55 PM EDT To: Clayborne Dana, NP  He is using Lantus 18 units and blood glucose has been < 130 fasting and < 180 after meals. What do you think about adding metforin ER  to see if eventually we might be able to taper him off Lantus?

## 2022-10-31 NOTE — Telephone Encounter (Signed)
Discussed metformin ER  daily. Patient is tolerating well - started 10/27/2022.  He would like instruction on how to use Continuous Glucose Monitor - appt in office 11/02/2022

## 2022-11-01 ENCOUNTER — Encounter: Payer: Self-pay | Admitting: Vascular Surgery

## 2022-11-01 ENCOUNTER — Ambulatory Visit (INDEPENDENT_AMBULATORY_CARE_PROVIDER_SITE_OTHER): Payer: BC Managed Care – PPO | Admitting: Vascular Surgery

## 2022-11-01 VITALS — BP 152/85 | HR 64 | Temp 98.5°F | Resp 20 | Ht 70.0 in | Wt 225.0 lb

## 2022-11-01 DIAGNOSIS — I7102 Dissection of abdominal aorta: Secondary | ICD-10-CM | POA: Diagnosis not present

## 2022-11-01 NOTE — Progress Notes (Signed)
[facility] [place]  ASSESSMENT / PLAN: 57 y.o. male with dissecting abdominal aortic aneurysm found during hospitalization for acute pancreatitis.  This has been stable over serial exams.  Counseled him that we would not recommend repair until aneurysm component reaches 55 mm.  We will need to monitor the incidental findings on his CT scan.  I will copy my note over to his primary care physician so they are aware as well.  I will see him again in 6 months with an abdominal aortic aneurysm duplex.  CHIEF COMPLAINT: Hospital follow-up  HISTORY OF PRESENT ILLNESS: Patrick [name] is a 57 y.o. male who returns to clinic for evaluation after hospitalization for acute pancreatitis.  Discovered during his workup was a dissecting abdominal aortic aneurysm involving the entire abdominal aorta.  The largest aneurysmal segment was 47 mm.  It was not clear to me at that time whether his dissection was acute or chronic.  In retrospect I think it was likely chronic.  He continues to struggle with high blood pressure, but has is under reasonable control.  He is stopped drinking altogether.  He feels better than he ever has.  He is working as he likes to.   Past Medical History:  Diagnosis Date   Hypertension    Kidney stone    Nosebleed     Past Surgical History:  Procedure Laterality Date   HERNIA REPAIR      History reviewed. No pertinent family history.  Social History   Socioeconomic History   Marital status: Married    Spouse name: Not on file   Number of children: Not on file   Years of education: Not on file   Highest education level: Not on file  Occupational History   Not on file  Tobacco Use   Smoking status: Former   Smokeless tobacco: Never  Substance and Sexual Activity   Alcohol use: Yes    Comment: daily   Drug use: No   Sexual activity: Not on file  Other Topics Concern   Not on file  Social History Narrative   Not on file   Social Determinants of  Health   Financial Resource Strain: Low Risk  (09/23/2022)   Overall Financial Resource Strain (CARDIA)    Difficulty of Paying Living Expenses: Not hard at all  Food Insecurity: No Food Insecurity (09/23/2022)   Hunger Vital Sign    Worried About Running Out of Food in the Last Year: Never true    Ran Out of Food in the Last Year: Never true  Transportation Needs: No Transportation Needs (09/23/2022)   PRAPARE - Administrator, Civil Service (Medical): No    Lack of Transportation (Non-Medical): No  Physical Activity: Not on file  Stress: Not on file  Social Connections: Not on file  Intimate Partner Violence: Not At Risk (09/23/2022)   Humiliation, Afraid, Rape, and Kick questionnaire    Fear of Current or Ex-Partner: No    Emotionally Abused: No    Physically Abused: No    Sexually Abused: No    No Known Allergies  Current Outpatient Medications  Medication Sig Dispense Refill   acetaminophen (TYLENOL) 500 MG tablet Take 500-1,000 mg by mouth every 6 (six) hours as needed for moderate pain.     amLODipine (NORVASC) 5 MG tablet Take 1 tablet (5 mg total) by mouth in the morning and at bedtime. 180 tablet 1   aspirin 81 MG chewable tablet Chew 325 mg by  mouth daily as needed (Chest Pain/Pain).     atorvastatin (LIPITOR) 20 MG tablet Take 1 tablet (20 mg total) by mouth daily. 90 tablet 1   b complex vitamins capsule Take 1 capsule by mouth daily.     carvedilol (COREG) 12.5 MG tablet Take 1 tablet (12.5 mg total) by mouth 2 (two) times daily with a meal. 180 tablet 1   cloNIDine (CATAPRES) 0.1 MG tablet Take 1 tablet (0.1 mg total) by mouth 2 (two) times daily. 180 tablet 1   fenofibrate 160 MG tablet Take 1 tablet (160 mg total) by mouth daily. 90 tablet 1   hydrALAZINE (APRESOLINE) 100 MG tablet Take 1 tablet (100 mg total) by mouth every 8 (eight) hours. 270 tablet 1   insulin glargine (LANTUS) 100 UNIT/ML Solostar Pen Inject 16 Units into the skin daily. (Patient on a  sliding scale) (Patient taking differently: Inject 18 Units into the skin daily. (Patient on a sliding scale)) 15 mL 0   losartan (COZAAR) 100 MG tablet Take 1 tablet (100 mg total) by mouth daily. 90 tablet 1   metFORMIN (GLUCOPHAGE-XR) 500 MG 24 hr tablet Take 1 tablet (500 mg total) by mouth daily with breakfast. 90 tablet 1   Multiple Vitamin (MULTIVITAMIN WITH MINERALS) TABS tablet Take 1 tablet by mouth daily.     psyllium (METAMUCIL) 58.6 % packet Take 1 packet by mouth daily.     sildenafil (VIAGRA) 50 MG tablet Take 1 tablet (50 mg total) by mouth as needed for erectile dysfunction. 10 tablet 5   spironolactone (ALDACTONE) 25 MG tablet Take 1 tablet (25 mg total) by mouth daily. 90 tablet 1   No current facility-administered medications for this visit.    PHYSICAL EXAM Vitals:   11/01/22 1522  BP: (!) 152/85  Pulse: 64  Resp: 20  Temp: 98.5 F (36.9 C)  SpO2: 99%  Weight: 225 lb (102.1 kg)  Height:  (1.778 m)   Well-appearing man in no distress Regular rate and rhythm Unlabored breathing Soft nontender abdomen Easily palpable dorsalis pedis pulses Trace edema about the ankles bilaterally   PERTINENT LABORATORY AND RADIOLOGIC DATA  Most recent CBC    Latest Ref Rng & Units 09/20/2022   12:35 AM 09/19/2022    5:51 AM 09/18/2022   11:47 PM  CBC  WBC 4.0 - 10.5 K/uL 10.3  10.4  12.5   Hemoglobin 13.0 - 17.0 g/dL 16.1  09.6  04.5   Hematocrit 39.0 - 52.0 % 39.2  39.8  44.3   Platelets 150 - 400 K/uL 153  170  212      Most recent CMP    Latest Ref Rng & Units 09/24/2022    1:56 AM 09/23/2022    3:33 AM 09/22/2022   12:00 AM  CMP  Glucose 70 - 99 mg/dL 409  811  88   BUN 6 - 20 mg/dL Creatinine 0.61 - 1.24 mg/dL 9.14  7.82  9.56   Sodium 135 - 145 mmol/L 134  135  135   Potassium 3.5 - 5.1 mmol/L 4.0  4.5  3.7   Chloride 98 - 111 mmol/L 104  103  105   CO2 22 - 32 mmol/L Calcium 8.9 - 10.3 mg/dL 8.9  8.7  8.7     Renal  function CrCl cannot be calculated (Patient's most recent lab result is older than the maximum 21 days allowed.).  Hgb A1c MFr Bld (%)  Date Value  09/19/2022 10.2 (H)    LDL Cholesterol  Date Value Ref Range Status  09/18/2022 UNABLE TO CALCULATE IF TRIGLYCERIDE OVER 400 mg/dL 0 - 99 mg/dL Final    Comment:    Performed at [facility], [address], [place], [place] [address]   Direct LDL  Date Value Ref Range Status  09/18/2022 <10 0 - 99 mg/dL Final    Comment:    Performed at [facility], [address], [place], [place] [address]    CT angiogram personally reviewed in detail. Overall stable appearance of the abdominal aorta with 47 mm infrarenal abdominal aortic aneurysm and perivisceral and pararenal dissection. Incidentally discovered stable splenic lesion and central mesenteric lesion which has been present for some time.  Attention will need to be paid to this in follow-up.  I suspect this is a reactive lymph node.  Groundglass opacities also noted along which will need attention and follow-up.   Rande Brunt. Lenell Antu, MD [facility] Phone Number: [phone] 11/01/2022 4:12 PM   Total time spent on preparing this encounter including chart review, data review, collecting history, examining the patient, coordinating care for this established patient, 30 minutes.  Portions of this report may have been transcribed using voice recognition software.  Every effort has been made to ensure accuracy; however, inadvertent computerized transcription errors may still be present.

## 2022-11-02 ENCOUNTER — Ambulatory Visit (INDEPENDENT_AMBULATORY_CARE_PROVIDER_SITE_OTHER): Payer: BC Managed Care – PPO | Admitting: Pharmacist

## 2022-11-02 DIAGNOSIS — Z794 Long term (current) use of insulin: Secondary | ICD-10-CM | POA: Diagnosis not present

## 2022-11-02 DIAGNOSIS — I10 Essential (primary) hypertension: Secondary | ICD-10-CM

## 2022-11-02 DIAGNOSIS — E1165 Type 2 diabetes mellitus with hyperglycemia: Secondary | ICD-10-CM | POA: Diagnosis not present

## 2022-11-02 MED ORDER — FREESTYLE LIBRE 3 SENSOR MISC: 5 refills | Status: DC

## 2022-11-02 NOTE — Progress Notes (Signed)
11/02/2022 Name: Patrick [name] MRN: [id number] DOB: 03-Dec-1965  Chief Complaint  Patient presents with   Diabetes    Patrick [name] is a 57 y.o. year old male who presented for an in person office visit.    They were referred to the pharmacist by their PCP for assistance in managing diabetes, hypertension, and hyperlipidemia.  He requested assistance started [employer] 3 sensors that he was provided at hospital discharge.   Patient was hospitalized for pancreatitis 09/18/2022 to 09/24/2022. Likely related to alcohol use and elevated triglycerides. Newly diagnoses type 2 DM (history of preDM 2 year ago).  Also noted to have dissecting aorta. Blood pressure goal < 120/80. Taking several blood pressure meds.   Subjective:  Care Team: Primary Care Provider: Clayborne Dana, NP ; Next Scheduled Visit: 11/2022 Vascular surgeon Cardiologist  Medication Access/Adherence  Current Pharmacy:  [id number] - [place], [place] - [address] [place] [address] Phone: [phone] Fax: [phone]   Patient reports affordability concerns with their medications: No  Patient reports access/transportation concerns to their pharmacy: No  Patient reports adherence concerns with their medications:  No      Diabetes:  Current medications:  Lantus 18 units daily and metformin ER  with evening meal (started about 1 week ago)  Medications tried in the past: took metformin  - 4 tablets daily for pre diabetes in past - stopped due to improved blood glucose and was lost to follow up with PCP.   Current glucose readings: 120 to 185 Testing 1 to 3 times daily with fingerstick glucometer.   No GLP1 due to pancreatitis history but might be able to consider later.    Patient denies hypoglycemic s/sx including no dizziness, shakiness, sweating. Patient denies hyperglycemic symptoms including no polyuria, polydipsia, polyphagia, nocturia,  neuropathy, blurred vision.  Current meal patterns:  Limiting sodium intake. Eating more veggies and leaner proteins / meats.    Prior to hospitalization he was having 4 to 5 drinks per night. Has not had alcoholic drink since returning home from hospital.    Hypertension: Diagnosed with dissecting aorta during hospitalization.  Goal blood pressure is < 120 / 80  Current medications: amlodipine  twice a day; carvedilol 12.5mg  twice a day, clonidine 0.1mg  twice a day, hydralazine  every 8 hours, losartan  daily and spironolactone  daily Medications previously tried:   Patient has a validated, automated, upper arm home BP cuff Current blood pressure readings readings: 115 to 147 / 70 to 88   Hyperlipidemia/ASCVD Risk Reduction  Current lipid lowering medications: atorvastatin  daily and fenofibrate  daily   Antiplatelet regimen: aspirin  daily    Objective:  Lab Results  Component Value Date   HGBA1C 10.2 (H) 09/19/2022    Lab Results  Component Value Date   CREATININE 0.95 09/24/2022   BUN 22 (H) 09/24/2022   NA 134 (L) 09/24/2022   K 4.0 09/24/2022   CL 104 09/24/2022   CO2 23 09/24/2022    Lab Results  Component Value Date   CHOL 599 (H) 09/18/2022   HDL NOT REPORTED DUE TO HIGH TRIGLYCERIDES 09/18/2022   LDLCALC UNABLE TO CALCULATE IF TRIGLYCERIDE OVER 400 mg/dL 41/32/4401   LDLDIRECT <10 09/18/2022   TRIG 445 (H) 09/23/2022   CHOLHDL NOT REPORTED DUE TO HIGH TRIGLYCERIDES 09/18/2022    Medications Reviewed Today     Reviewed by Henrene Pastor, RPH-CPP (Pharmacist) on 11/02/22 at 1154  Med List Status: <None>   Medication Order Taking? Sig Documenting Provider Last Dose Status Informant  acetaminophen (TYLENOL) 500 MG tablet [id number] Yes Take 500-1,000 mg by mouth every 6 (six) hours as needed for moderate pain. [provider] Taking Active Self, Spouse/Significant Other, Pharmacy Records  amLODipine (NORVASC) 5 MG  tablet [id number] Yes Take 1 tablet (5 mg total) by mouth in the morning and at bedtime. Clayborne Dana, NP Taking Active   aspirin 81 MG chewable tablet [id number] Yes Chew 325 mg by mouth daily as needed (Chest Pain/Pain). [provider] Taking Active Self, Spouse/Significant Other, Pharmacy Records  atorvastatin (LIPITOR) 20 MG tablet [id number] Yes Take 1 tablet (20 mg total) by mouth daily. Clayborne Dana, NP Taking Active   b complex vitamins capsule [id number] Yes Take 1 capsule by mouth daily. [provider] Taking Active   carvedilol (COREG) 12.5 MG tablet [id number] Yes Take 1 tablet (12.5 mg total) by mouth 2 (two) times daily with a meal. Clayborne Dana, NP Taking Active   cloNIDine (CATAPRES) 0.1 MG tablet [id number] Yes Take 1 tablet (0.1 mg total) by mouth 2 (two) times daily. Clayborne Dana, NP Taking Active   fenofibrate 160 MG tablet [id number] Yes Take 1 tablet (160 mg total) by mouth daily. Clayborne Dana, NP Taking Active   hydrALAZINE (APRESOLINE) 100 MG tablet [id number] Yes Take 1 tablet (100 mg total) by mouth every 8 (eight) hours. Clayborne Dana, NP Taking Active   insulin glargine (LANTUS) 100 UNIT/ML Solostar Pen [id number] Yes Inject 16 Units into the skin daily. (Patient on a sliding scale)  Patient taking differently: Inject 18 Units into the skin daily. (Patient on a sliding scale)   Clayborne Dana, NP Taking Active   losartan (COZAAR) 100 MG tablet [id number] Yes Take 1 tablet (100 mg total) by mouth daily. Clayborne Dana, NP Taking Active   metFORMIN (GLUCOPHAGE-XR) 500 MG 24 hr tablet [id number] Yes Take 1 tablet (500 mg total) by mouth daily with breakfast. Clayborne Dana, NP Taking Active   Multiple Vitamin (MULTIVITAMIN WITH MINERALS) TABS tablet [id number] Yes Take 1 tablet by mouth daily. Pahwani, Kasandra Knudsen, MD Taking Active   psyllium (METAMUCIL) 58.6 % packet [id number] Yes Take 1 packet by mouth daily. [provider] Taking Active Self,  Spouse/Significant Other, Pharmacy Records  sildenafil (VIAGRA) 50 MG tablet [id number] Yes Take 1 tablet (50 mg total) by mouth as needed for erectile dysfunction. Clayborne Dana, NP Taking Active   spironolactone (ALDACTONE) 25 MG tablet [id number] Yes Take 1 tablet (25 mg total) by mouth daily. Clayborne Dana, NP Taking Active               Assessment/Plan:   Diabetes: Newly diagnosed type 2 DM with hypertriglyceridemia and incidence of pancreatitis. History of preDM - Continue Lantus 18 units daily and metformin ER 500mg  daily  - Patient received the following instruction for [employer] Personal CMG:  - preparation of placement site - clean with alcohol and allow to dry.  Sensor is to   only be place on back of upper arm.  Patient to rotate sides and site.   -prep of sensor and applicator.   -care of sensor and site   - reminded that sensor is waterproof up to 3 feet and for 30 minutes.   - taught how to scan sensor and evaluted BG reading using his phone  - taught about trend arrows and how to interpret   -  reminded that when magnifying glass symbols shows up she is to confirm BG   with finger stick before making any treatment decisions.   - Connected patient's Josephine Igo view account with practice account.    Hypertension: blood pressure goal < 120/80 - Continue current regimen for blood pressure - patient will see cardiologist in the next month.  - Reviewed appropriate blood pressure monitoring technique and reviewed goal blood pressure. Recommended to check home blood pressure and heart rate daily. Recommended patient check blood pressure 2 or 3 times and average blood pressure.    Hyperlipidemia/ASCVD Risk Reduction: suspect that pancreatitis episode was related to alcohol use and elevated triglycerides as well as undiagnosed type 2 DM.  - Continue fenofibrate and atorvastatin for now. Hopefully since he has stopped alcohol use and blood glucose is improving might be able to  stop fenofibrate in the future.   Henrene Pastor, PharmD Clinical Pharmacist [facility]

## 2022-11-02 NOTE — Addendum Note (Signed)
Addended by: Henrene Pastor B on: 11/02/2022 01:41 PM   Modules accepted: Orders

## 2022-11-09 ENCOUNTER — Other Ambulatory Visit: Payer: Self-pay

## 2022-11-09 DIAGNOSIS — I7102 Dissection of abdominal aorta: Secondary | ICD-10-CM

## 2022-11-14 DIAGNOSIS — R9389 Abnormal findings on diagnostic imaging of other specified body structures: Secondary | ICD-10-CM | POA: Insufficient documentation

## 2022-11-14 NOTE — Progress Notes (Unsigned)
Established Patient Office Visit  Subjective   Patient ID: Patrick [name], male    DOB: 1965/09/22  Age: 57 y.o. MRN: [id number]  No chief complaint on file.   HPI  Patient is here for follow-up. He established here in April 2024 after hospitalization for pancreatitis, uncontrolled diabetes, and abdominal aortic dissection.   Hypertension, Dissecting AAA (BP goal <120/80): - Medications: amlodipine 5 mg BID, carvedilol 12.5 mg BID, clonidine 0.1 mg BID, hydralazine 100 mg TID, losartan 100 mg daily, spironolactone 25 mg daily, aspirin 81 mg daily, atorvastatin 20 mg daily, fenofibrate 160 mg daily,  - Compliance: *** - Checking BP at home: *** - Denies any SOB, recurrent headaches, CP, vision changes, LE edema, dizziness, palpitations, or medication side effects. - Diet: *** - Exercise: *** - 11/01/22 Vascular Follow-up (Dr. Lenell Antu): Repeat CT showed stable AAA (47 mm), but incidentally discovered a stable splenic lesion and central mesenteric lesion (present for some time), suspected to be reactive lymph node - will need to monitor. Also noted indeterminate ground-glass opacity to posterior left lung - recommending 3-6 month non contrast CT and repeat at 18-24 months if stable after initial repeat. Planning for abdominal aortic aneurysm duplex around November 2024. - Scheduled to see cardiology, Dr. Bing Matter on 11/30/22.  Diabetes: - Checking glucose at home: *** - Medications: metformin 500 mg daily, Lantus 18 units daily, *** - Compliance: *** - Diet: *** - Exercise: *** - Eye exam: *** - Foot exam: *** - Microalbumin: *** - Denies symptoms of hypoglycemia, polyuria, polydipsia, numbness extremities, foot ulcers/trauma, wounds that are not healing, medication side effects  - Currently avoiding GLP-1 due to recent pancreatitis      CTA chest/abd/pelvis 10/31/22 IMPRESSION: 1. Minimal change in the abdominal aortic aneurysm and dissection. Proximal abdominal aorta has minimally  enlarged since March 2024. Configuration and extent of the abdominal aortic dissection is stable. Maximum dimension of the abdominal aortic aneurysm is 4.7 cm and stable. 2. Indeterminate ground-glass opacity in the posterior left lung. This area measures up to 1.5 cm. Multiple additional small nodules scattered throughout the lungs. Non-contrast chest CT at 3-6 months is recommended. If the nodules are stable at time of repeat CT, then future CT at 18-24 months (from today's scan) is considered optional for low-risk patients, but is recommended for high-risk patients. This recommendation follows the consensus statement: Guidelines for Management of Incidental Pulmonary Nodules Detected on CT Images: From the Fleischner Society 2017; Radiology 2017; 284:228-243. 3. **An incidental finding of potential clinical significance has been found. Slowly enlarging low-density structure in the central mesentery near the proximal jejunum and ligament of Treitz. In retrospect, this structure was present in 2018 and it has slowly enlarged. This structure measures up to 2.5 cm. The minimal growth since 2018 is reassuring suggesting this could represent a benign etiology such as a reactive lymph node or atypical mesenteric or duplication cyst. However, an indolent neoplastic process cannot be excluded. Recommend continued surveillance of this area versus further characterization with a PET-CT.** 4. Cholelithiasis. 5. Slowly enlarging low-density structure in the spleen. This is likely a benign etiology. 6. Celiac trunk is mildly dilated with a slightly beaded configuration. Findings could represent underlying fibromuscular dysplasia.     {History (Optional):23778}  ROS All review of systems negative except what is listed in the HPI    Objective:     There were no vitals taken for this visit. {Vitals History (Optional):23777}  Physical Exam   No results found for any visits  on  11/15/22.  {Labs (Optional):23779}  The ASCVD Risk score (Arnett DK, et al., 2019) failed to calculate for the following reasons:   The valid HDL cholesterol range is 20 to 100 mg/dL   The valid total cholesterol range is 130 to 320 mg/dL    Assessment & Plan:   Problem List Items Addressed This Visit     Type 2 diabetes mellitus with hyperglycemia, with long-term current use of insulin (HCC) - Primary   Other Visit Diagnoses     Encounter for long-term (current) use of insulin (HCC)           No follow-ups on file.    Clayborne Dana, NP

## 2022-11-15 ENCOUNTER — Encounter: Payer: Self-pay | Admitting: Family Medicine

## 2022-11-15 ENCOUNTER — Ambulatory Visit (INDEPENDENT_AMBULATORY_CARE_PROVIDER_SITE_OTHER): Payer: BC Managed Care – PPO | Admitting: Family Medicine

## 2022-11-15 VITALS — BP 120/81 | HR 66 | Ht 70.0 in | Wt 225.0 lb

## 2022-11-15 DIAGNOSIS — R9389 Abnormal findings on diagnostic imaging of other specified body structures: Secondary | ICD-10-CM | POA: Diagnosis not present

## 2022-11-15 DIAGNOSIS — I10 Essential (primary) hypertension: Secondary | ICD-10-CM | POA: Diagnosis not present

## 2022-11-15 DIAGNOSIS — E781 Pure hyperglyceridemia: Secondary | ICD-10-CM | POA: Insufficient documentation

## 2022-11-15 DIAGNOSIS — R911 Solitary pulmonary nodule: Secondary | ICD-10-CM | POA: Diagnosis not present

## 2022-11-15 DIAGNOSIS — Z794 Long term (current) use of insulin: Secondary | ICD-10-CM

## 2022-11-15 DIAGNOSIS — E1165 Type 2 diabetes mellitus with hyperglycemia: Secondary | ICD-10-CM

## 2022-11-15 DIAGNOSIS — I7102 Dissection of abdominal aorta: Secondary | ICD-10-CM | POA: Diagnosis not present

## 2022-11-15 LAB — LIPID PANEL
Cholesterol: 106 mg/dL (ref 0–200)
HDL: 25.4 mg/dL — ABNORMAL LOW (ref >39.00)
NonHDL: 81.03
Total CHOL/HDL Ratio: 4
Triglycerides: 201 mg/dL — ABNORMAL HIGH (ref 0.0–149.0)
VLDL: 40.2 mg/dL — ABNORMAL HIGH (ref 0.0–40.0)

## 2022-11-15 LAB — COMPREHENSIVE METABOLIC PANEL
ALT: 38 U/L (ref 0–53)
AST: 21 U/L (ref 0–37)
Albumin: 4.5 g/dL (ref 3.5–5.2)
Alkaline Phosphatase: 61 U/L (ref 39–117)
BUN: 21 mg/dL (ref 6–23)
CO2: 24 mEq/L (ref 19–32)
Calcium: 9.4 mg/dL (ref 8.4–10.5)
Chloride: 106 mEq/L (ref 96–112)
Creatinine, Ser: 0.86 mg/dL (ref 0.40–1.50)
GFR: 96.17 mL/min (ref >60.00)
Glucose, Bld: 167 mg/dL — ABNORMAL HIGH (ref 70–99)
Potassium: 4.1 mEq/L (ref 3.5–5.1)
Sodium: 138 mEq/L (ref 135–145)
Total Bilirubin: 0.4 mg/dL (ref 0.2–1.2)
Total Protein: 6.9 g/dL (ref 6.0–8.3)

## 2022-11-15 LAB — LDL CHOLESTEROL, DIRECT: Direct LDL: 53 mg/dL

## 2022-11-15 LAB — MICROALBUMIN / CREATININE URINE RATIO
Creatinine,U: 112.4 mg/dL
Microalb Creat Ratio: 1.5 mg/g (ref 0.0–30.0)
Microalb, Ur: 1.7 mg/dL (ref 0.0–1.9)

## 2022-11-15 MED ORDER — METFORMIN HCL ER 500 MG PO TB24: 500.0000 mg | ORAL_TABLET | Freq: Two times a day (BID) | ORAL | 1 refills | Status: DC

## 2022-11-15 NOTE — Assessment & Plan Note (Signed)
Close BP control.  Lipid management. Following with Vascular and Cardiology

## 2022-11-15 NOTE — Assessment & Plan Note (Signed)
Try adding a second carvedilol in the evenings (12.5 mg in the morning, 25 mg in the evening), with hopes of preventing such high morning BP readings. Cardiology to reevaluate at upcoming appointment

## 2022-11-15 NOTE — Assessment & Plan Note (Signed)
Continue current meds Following with cardiology Patient would like to recheck levels today

## 2022-11-15 NOTE — Patient Instructions (Signed)
-  Try adding a second carvedilol in the evenings (12.5 mg in the morning, 25 mg in the evening), with hopes of preventing such high morning BP readings. Cardiology to reevaluate at upcoming appointment.  -Increase metformin 500 mg to twice daily -After about a week of the new metformin, if you are still having fasting glucose levels >130, then you can increase insulin by 2 units every 3 days. We will be able to recheck A1c late June. Continue with healthy lifestyle and exercise.  -Ordering the future repeat CT scan to be done between August-October of this year. They will be calling you to schedule.

## 2022-11-15 NOTE — Assessment & Plan Note (Signed)
Discussed findings with patient Ordering repeat CT chest for this fall per radiologist recommendations

## 2022-11-15 NOTE — Assessment & Plan Note (Addendum)
-  Increase metformin 500 mg to twice daily -After about a week of the new metformin, if you are still having fasting glucose levels >130, then you can increase insulin by 2 units every 3 days. We will be able to recheck A1c late June. Continue with healthy lifestyle and exercise.

## 2022-11-15 NOTE — Assessment & Plan Note (Signed)
Discussed findings with patient He does report a history of smoking about a pack per week for 12 years; quit about 20 years ago Will go ahead and place future order for repeat CT per radiologist recommendation - patient to schedule

## 2022-11-30 ENCOUNTER — Ambulatory Visit: Payer: BC Managed Care – PPO | Admitting: Cardiology

## 2022-12-14 ENCOUNTER — Other Ambulatory Visit: Payer: Self-pay | Admitting: Family Medicine

## 2022-12-14 ENCOUNTER — Encounter: Payer: Self-pay | Admitting: Family Medicine

## 2022-12-14 DIAGNOSIS — E1165 Type 2 diabetes mellitus with hyperglycemia: Secondary | ICD-10-CM

## 2022-12-14 DIAGNOSIS — I10 Essential (primary) hypertension: Secondary | ICD-10-CM

## 2022-12-14 MED ORDER — CARVEDILOL 12.5 MG PO TABS: ORAL_TABLET | ORAL | 1 refills | Status: DC

## 2022-12-14 MED ORDER — PEN NEEDLES 32G X 4 MM MISC: 1.0000 | Freq: Every day | 4 refills | Status: DC

## 2022-12-14 NOTE — Addendum Note (Signed)
Addended bySilvio Pate on: 12/14/2022 04:59 PM   Modules accepted: Orders

## 2023-01-02 ENCOUNTER — Encounter: Payer: Self-pay | Admitting: Family Medicine

## 2023-01-02 ENCOUNTER — Ambulatory Visit (INDEPENDENT_AMBULATORY_CARE_PROVIDER_SITE_OTHER): Payer: BC Managed Care – PPO | Admitting: Family Medicine

## 2023-01-02 VITALS — BP 140/59 | HR 62 | Ht 70.0 in | Wt 228.0 lb

## 2023-01-02 DIAGNOSIS — E1165 Type 2 diabetes mellitus with hyperglycemia: Secondary | ICD-10-CM | POA: Diagnosis not present

## 2023-01-02 DIAGNOSIS — I7102 Dissection of abdominal aorta: Secondary | ICD-10-CM

## 2023-01-02 DIAGNOSIS — Z794 Long term (current) use of insulin: Secondary | ICD-10-CM | POA: Diagnosis not present

## 2023-01-02 DIAGNOSIS — I10 Essential (primary) hypertension: Secondary | ICD-10-CM | POA: Diagnosis not present

## 2023-01-02 LAB — HEMOGLOBIN A1C: Hgb A1c MFr Bld: 6.7 % — ABNORMAL HIGH (ref 4.6–6.5)

## 2023-01-02 MED ORDER — CARVEDILOL 25 MG PO TABS: 25.0000 mg | ORAL_TABLET | Freq: Two times a day (BID) | ORAL | 1 refills | Status: DC

## 2023-01-02 NOTE — Assessment & Plan Note (Signed)
Hypertension: Blood pressure slightly elevated at today's visit. Patient reports variable readings at home, currently on Amlodipine 5mg  BID, Carvedilol 12.5 mg in the morning and 25 mg at night, Clonidine 0.1mg  BID, Hydralazine 100mg  TID, Losartan 100mg  daily, and Spironolactone 25mg  daily. -Encourage patient to continue home blood pressure monitoring. -Increase Carvedilol to 25 mg BID -Cardiology follow-up on July 19th for further management. -Encouraged him to follow-up with pulmonology referral regarding sleep study

## 2023-01-02 NOTE — Progress Notes (Signed)
Established Patient Office Visit  Subjective   Patient ID: Patrick [name], male    DOB: 1966/03/22  Age: 57 y.o. MRN: [id number]  Chief Complaint  Patient presents with   Medical Management of Chronic Issues   Diabetes     Discussed the use of AI scribe software for clinical note transcription with the patient, who gave verbal consent to proceed.  History of Present Illness   The patient, with a history of diabetes, hypertension, and hyperlipidemia, presents for a six-week follow-up. He reports using a Libre device for glucose monitoring and is generally satisfied with it. He notes that his fasting glucose levels have been slightly elevated in the past two weeks due to dietary changes (children have been in town visiting), but prior to that, they were consistently in the "green zone." He rarely goes below 100, but morning readings are often higher than expected, in the 130s-140s. He also notes a spike in glucose levels after eating.  The patient is currently on 16 units of insulin in the morning, along with metformin 500mg  twice daily. He also takes medications for cholesterol and blood pressure. He has been monitoring his blood pressure at home, noting occasional high readings. He is scheduled to follow up with cardiology and vascular teams in the coming weeks.  The patient also reports some swelling in his ankles, which he attributes to his amlodipine medication. He has been managing this with compression socks and by elevating his feet when possible. He denies any chest pain, shortness of breath, or other major concerns.           Lab Results  Component Value Date   HGBA1C 10.2 (H) 09/19/2022        ROS All review of systems negative except what is listed in the HPI    Objective:     BP (!) 140/59   Pulse 62   Ht 5\' 10"  (1.778 m)   Wt 228 lb (103.4 kg)   SpO2 98%   BMI 32.71 kg/m    Physical Exam Vitals reviewed.  Constitutional:      Appearance: Normal  appearance.  Cardiovascular:     Rate and Rhythm: Normal rate and regular rhythm.     Pulses: Normal pulses.     Heart sounds: Normal heart sounds.  Pulmonary:     Effort: Pulmonary effort is normal.     Breath sounds: Normal breath sounds.  Musculoskeletal:     Right lower leg: No edema.     Left lower leg: No edema.  Skin:    General: Skin is warm and dry.  Neurological:     Mental Status: He is alert and oriented to person, place, and time.  Psychiatric:        Mood and Affect: Mood normal.        Behavior: Behavior normal.        Thought Content: Thought content normal.        Judgment: Judgment normal.      No results found for any visits on 01/02/23.    The ASCVD Risk score (Arnett DK, et al., 2019) failed to calculate for the following reasons:   The valid total cholesterol range is 130 to 320 mg/dL    Assessment & Plan:   Problem List Items Addressed This Visit     Dissecting AAA (abdominal aortic aneurysm) (HCC) (Chronic)    Close BP control.  Lipid management. Following with Vascular and Cardiology  Asymptomatic currently  Relevant Medications   carvedilol (COREG) 25 MG tablet   Essential hypertension (Chronic)    Hypertension: Blood pressure slightly elevated at today's visit. Patient reports variable readings at home, currently on Amlodipine 5mg  BID, Carvedilol 12.5 mg in the morning and 25 mg at night, Clonidine 0.1mg  BID, Hydralazine 100mg  TID, Losartan 100mg  daily, and Spironolactone 25mg  daily. -Encourage patient to continue home blood pressure monitoring. -Increase Carvedilol to 25 mg BID -Cardiology follow-up on July 19th for further management. -Encouraged him to follow-up with pulmonology referral regarding sleep study       Relevant Medications   carvedilol (COREG) 25 MG tablet   Type 2 diabetes mellitus with hyperglycemia, with long-term current use of insulin (HCC) - Primary (Chronic)    Type 2 Diabetes Mellitus: Last A1c was 10.2 on  March 11th. Patient reports good control with [facility], but has noticed increased fasting glucose levels in the 130s-140s and postprandial spikes. Currently on 16 units of insulin in the morning and Metformin 500mg  BID. -Check A1c today. -Continue current regimen and monitor glucose levels with Freestyle Libre.      Relevant Orders   HgB A1c   Other Visit Diagnoses     Encounter for long-term (current) use of insulin (HCC)               Return in about 3 months (around 04/04/2023) for routine follow-up.    Clayborne Dana, NP

## 2023-01-02 NOTE — Assessment & Plan Note (Signed)
Type 2 Diabetes Mellitus: Last A1c was 10.2 on March 11th. Patient reports good control with [facility], but has noticed increased fasting glucose levels in the 130s-140s and postprandial spikes. Currently on 16 units of insulin in the morning and Metformin 500mg  BID. -Check A1c today. -Continue current regimen and monitor glucose levels with Freestyle Libre.

## 2023-01-02 NOTE — Patient Instructions (Addendum)
BP still not at goal consistently. Continue current meds. Increase Coreg to 25 mg twice a day. Keep upcoming cardiology appointment.   [facility] [phone] for sleep study

## 2023-01-02 NOTE — Assessment & Plan Note (Signed)
Close BP control.  Lipid management. Following with Vascular and Cardiology  Asymptomatic currently

## 2023-01-09 ENCOUNTER — Telehealth (HOSPITAL_BASED_OUTPATIENT_CLINIC_OR_DEPARTMENT_OTHER): Payer: Self-pay

## 2023-01-17 NOTE — Progress Notes (Signed)
Referring-Taylor Reola Calkins NP Reason for referral-hypertension  HPI: 57 year old male for evaluation of hypertension at request of Hyman Hopes NP.  Previously followed by cardiology at [facility].  Stress echocardiogram December 2019 normal.  Patient is also followed by vascular surgery for dissecting abdominal aortic aneurysm which was found during a hospitalization for acute pancreatitis.  Patient has had difficulties with hypertension.  Previous evaluation for secondary causes included normal plasma metanephrine and normetanephrine concentrations, normal plasma renin activity and slightly elevated aldosterone concentration at 24.  Also with history of renal Doppler showing no renal artery stenosis.  Last CTA April 2024 showed abdominal aortic aneurysm/dissection with maximum dimension of 4.7 cm, lung nodules with follow-up recommended at 6 months, slowly enlarging density in the central mesentery near the proximal jejunum and follow-up recommended, mildly dilated celiac trunk with slightly beaded configuration.  Note there was no significant renal artery stenosis noted.  Laboratories May 2024 showed sodium 138, potassium 4.1, BUN 21, creatinine 0.86. The patient denies any dyspnea on exertion, orthopnea, PND, palpitations, syncope or chest pain. Mild pedal edema.   Current Outpatient Medications  Medication Sig Dispense Refill   acetaminophen (TYLENOL) 500 MG tablet Take 500-1,000 mg by mouth every 6 (six) hours as needed for moderate pain.     amLODipine (NORVASC) 5 MG tablet Take 1 tablet (5 mg total) by mouth in the morning and at bedtime. 180 tablet 1   aspirin 81 MG chewable tablet Chew 325 mg by mouth daily as needed (Chest Pain/Pain).     atorvastatin (LIPITOR) 20 MG tablet Take 1 tablet (20 mg total) by mouth daily. 90 tablet 1   b complex vitamins capsule Take 1 capsule by mouth daily.     carvedilol (COREG) 25 MG tablet Take 1 tablet (25 mg total) by mouth 2 (two) times daily  with a meal. 1 tablet in the morning, 2 tablets in the evening 180 tablet 1   cloNIDine (CATAPRES) 0.1 MG tablet Take 1 tablet (0.1 mg total) by mouth 2 (two) times daily. 180 tablet 1   Continuous Glucose Sensor (FREESTYLE LIBRE 3 SENSOR) MISC Place 1 sensor on the skin every 14 days. Use to check glucose continuously. 2 each 5   fenofibrate 160 MG tablet Take 1 tablet (160 mg total) by mouth daily. 90 tablet 1   hydrALAZINE (APRESOLINE) 100 MG tablet Take 1 tablet (100 mg total) by mouth every 8 (eight) hours. 270 tablet 1   insulin glargine-yfgn (SEMGLEE) 100 UNIT/ML Pen INJECT 16 UNITS INTO THE SKIN ONCE DAILY 6 mL 5   Insulin Pen Needle (PEN NEEDLES) 32G X 4 MM MISC 1 each by Does not apply route daily. To use with insulin 100 each 4   losartan (COZAAR) 100 MG tablet Take 1 tablet (100 mg total) by mouth daily. 90 tablet 1   metFORMIN (GLUCOPHAGE-XR) 500 MG 24 hr tablet Take 1 tablet (500 mg total) by mouth 2 (two) times daily with a meal. 90 tablet 1   Multiple Vitamin (MULTIVITAMIN WITH MINERALS) TABS tablet Take 1 tablet by mouth daily.     psyllium (METAMUCIL) 58.6 % packet Take 1 packet by mouth daily.     sildenafil (VIAGRA) 50 MG tablet Take 1 tablet (50 mg total) by mouth as needed for erectile dysfunction. 10 tablet 5   spironolactone (ALDACTONE) 25 MG tablet Take 1 tablet (25 mg total) by mouth daily. 90 tablet 1   No current facility-administered medications for this visit.  No Known Allergies   Past Medical History:  Diagnosis Date   AAA (abdominal aortic aneurysm) (HCC)    Aortic dissection (HCC)    Diabetes mellitus (HCC)    Hyperlipidemia    Hypertension    Kidney stone    Nosebleed     Past Surgical History:  Procedure Laterality Date   HERNIA REPAIR      Social History   Socioeconomic History   Marital status: Married    Spouse name: Not on file   Number of children: 4   Years of education: Not on file   Highest education level: Not on file   Occupational History   Not on file  Tobacco Use   Smoking status: Former   Smokeless tobacco: Never  Substance and Sexual Activity   Alcohol use: Not Currently    Comment: daily   Drug use: Yes    Types: Marijuana   Sexual activity: Not on file  Other Topics Concern   Not on file  Social History Narrative   Not on file   Social Determinants of Health   Financial Resource Strain: Low Risk  (09/23/2022)   Overall Financial Resource Strain (CARDIA)    Difficulty of Paying Living Expenses: Not hard at all  Food Insecurity: No Food Insecurity (09/23/2022)   Hunger Vital Sign    Worried About Running Out of Food in the Last Year: Never true    Ran Out of Food in the Last Year: Never true  Transportation Needs: No Transportation Needs (09/23/2022)   PRAPARE - Administrator, Civil Service (Medical): No    Lack of Transportation (Non-Medical): No  Physical Activity: Not on file  Stress: Not on file  Social Connections: Not on file  Intimate Partner Violence: Not At Risk (09/23/2022)   Humiliation, Afraid, Rape, and Kick questionnaire    Fear of Current or Ex-Partner: No    Emotionally Abused: No    Physically Abused: No    Sexually Abused: No    Family History  Problem Relation Age of Onset   Hypertension Mother     ROS: no fevers or chills, productive cough, hemoptysis, dysphasia, odynophagia, melena, hematochezia, dysuria, hematuria, rash, seizure activity, orthopnea, PND, claudication. Remaining systems are negative.  Physical Exam:   Blood pressure 134/84, pulse (!) 59, height 5\' 10"  (1.778 m), weight 225 lb 9.6 oz (102.3 kg), SpO2 97%.  General:  Well developed/well nourished in NAD Skin warm/dry Patient not depressed No peripheral clubbing Back-normal HEENT-normal/normal eyelids Neck supple/normal carotid upstroke bilaterally; no bruits; no JVD; no thyromegaly chest - CTA/ normal expansion CV - RRR/normal S1 and S2; no murmurs, rubs;  PMI  nondisplaced. Positive S4. Abdomen -NT/ND, no HSM, no mass, + bowel sounds, no bruit 2+ femoral pulses, no bruits Ext-trace to 1+ ankle edema, no chords, 2+ DP Neuro-grossly nonfocal  EKG Interpretation Date/Time:  Friday January 27 2023 15:46:50 EDT Ventricular Rate:  59 PR Interval:  164 QRS Duration:  152 QT Interval:  502 QTC Calculation: 496 R Axis:   -25  Text Interpretation: Sinus bradycardia Left bundle branch block Confirmed by Olga Millers ([id number]) on 01/27/2023 3:57:06 PM    A/P  1 hypertension-BP controlled; continue present meds (goal SBP <130 and DBP <85).   2 abnormal ECG-LBBB noted; check echo for LV function.  3 aortic aneurysm/dissection-patient is followed by vascular surgery.  4 lung nodules/abdominal density near the jejunum-I have asked patient to follow-up with primary care for this issue.  Olga Millers,  MD

## 2023-01-27 ENCOUNTER — Ambulatory Visit: Payer: BC Managed Care – PPO | Admitting: Cardiology

## 2023-01-27 ENCOUNTER — Encounter: Payer: Self-pay | Admitting: Cardiology

## 2023-01-27 VITALS — BP 134/84 | HR 59 | Ht 70.0 in | Wt 225.6 lb

## 2023-01-27 DIAGNOSIS — R9431 Abnormal electrocardiogram [ECG] [EKG]: Secondary | ICD-10-CM | POA: Diagnosis not present

## 2023-01-27 DIAGNOSIS — I7102 Dissection of abdominal aorta: Secondary | ICD-10-CM | POA: Diagnosis not present

## 2023-01-27 DIAGNOSIS — I10 Essential (primary) hypertension: Secondary | ICD-10-CM | POA: Diagnosis not present

## 2023-01-27 NOTE — Patient Instructions (Addendum)
Medication Instructions:  NO CHANGES *If you need a refill on your cardiac medications before your next appointment, please call your pharmacy*   Testing/Procedures: Your physician has requested that you have an echocardiogram. Echocardiography is a painless test that uses sound waves to create images of your heart. It provides your doctor with information about the size and shape of your heart and how well your heart's chambers and valves are working. This procedure takes approximately one hour. There are no restrictions for this procedure. Please do NOT wear cologne, perfume, aftershave, or lotions (deodorant is allowed). Please arrive 15 minutes prior to your appointment time. [facility].   Follow-Up: At [facility], you and your health needs are our priority.  As part of our continuing mission to provide you with exceptional heart care, we have created designated Provider Care Teams.  These Care Teams include your primary Cardiologist (physician) and Advanced Practice Providers (APPs -  Physician Assistants and Nurse Practitioners) who all work together to provide you with the care you need, when you need it.  We recommend signing up for the patient portal called "MyChart".  Sign up information is provided on this After Visit Summary.  MyChart is used to connect with patients for Virtual Visits (Telemedicine).  Patients are able to view lab/test results, encounter notes, upcoming appointments, etc.  Non-urgent messages can be sent to your provider as well.   To learn more about what you can do with MyChart, go to [email or web address].    Your next appointment:   6 month(s)  Provider:   Olga Millers MD

## 2023-02-23 ENCOUNTER — Ambulatory Visit (HOSPITAL_BASED_OUTPATIENT_CLINIC_OR_DEPARTMENT_OTHER): Payer: BC Managed Care – PPO

## 2023-02-23 ENCOUNTER — Encounter (INDEPENDENT_AMBULATORY_CARE_PROVIDER_SITE_OTHER): Payer: Self-pay

## 2023-03-19 ENCOUNTER — Other Ambulatory Visit: Payer: Self-pay | Admitting: Family Medicine

## 2023-03-19 DIAGNOSIS — E1165 Type 2 diabetes mellitus with hyperglycemia: Secondary | ICD-10-CM

## 2023-03-27 ENCOUNTER — Ambulatory Visit (HOSPITAL_BASED_OUTPATIENT_CLINIC_OR_DEPARTMENT_OTHER)
Admission: RE | Admit: 2023-03-27 | Discharge: 2023-03-27 | Disposition: A | Discharge status: Home / Self Care | Payer: BC Managed Care – PPO | Source: Ambulatory Visit | Attending: Cardiology | Admitting: Cardiology

## 2023-03-27 DIAGNOSIS — R9431 Abnormal electrocardiogram [ECG] [EKG]: Secondary | ICD-10-CM | POA: Diagnosis not present

## 2023-03-27 LAB — ECHOCARDIOGRAM COMPLETE
AR max vel: 3.16 cm2
AV Area VTI: 2.97 cm2
AV Area mean vel: 3.18 cm2
AV Mean grad: 5 mmHg
AV Peak grad: 8.1 mmHg
Ao pk vel: 1.42 m/s
Area-P 1/2: 4.21 cm2
Calc EF: 51.9 %
MV M vel: 3.35 m/s
MV Peak grad: 44.9 mmHg
S' Lateral: 3.5 cm
Single Plane A2C EF: 50.9 %
Single Plane A4C EF: 50.9 %

## 2023-03-31 ENCOUNTER — Encounter: Payer: Self-pay | Admitting: *Deleted

## 2023-04-03 ENCOUNTER — Encounter: Payer: Self-pay | Admitting: Family Medicine

## 2023-04-03 ENCOUNTER — Ambulatory Visit (INDEPENDENT_AMBULATORY_CARE_PROVIDER_SITE_OTHER): Payer: BC Managed Care – PPO | Admitting: Family Medicine

## 2023-04-03 VITALS — BP 105/61 | HR 62 | Ht 70.0 in | Wt 230.0 lb

## 2023-04-03 DIAGNOSIS — I10 Essential (primary) hypertension: Secondary | ICD-10-CM | POA: Diagnosis not present

## 2023-04-03 DIAGNOSIS — I7102 Dissection of abdominal aorta: Secondary | ICD-10-CM | POA: Diagnosis not present

## 2023-04-03 DIAGNOSIS — N509 Disorder of male genital organs, unspecified: Secondary | ICD-10-CM

## 2023-04-03 DIAGNOSIS — Z794 Long term (current) use of insulin: Secondary | ICD-10-CM | POA: Diagnosis not present

## 2023-04-03 DIAGNOSIS — E781 Pure hyperglyceridemia: Secondary | ICD-10-CM

## 2023-04-03 DIAGNOSIS — E1165 Type 2 diabetes mellitus with hyperglycemia: Secondary | ICD-10-CM | POA: Diagnosis not present

## 2023-04-03 LAB — COMPREHENSIVE METABOLIC PANEL
ALT: 18 U/L (ref 0–53)
AST: 15 U/L (ref 0–37)
Albumin: 4.3 g/dL (ref 3.5–5.2)
Alkaline Phosphatase: 72 U/L (ref 39–117)
BUN: 22 mg/dL (ref 6–23)
CO2: 22 mEq/L (ref 19–32)
Calcium: 9.5 mg/dL (ref 8.4–10.5)
Chloride: 109 mEq/L (ref 96–112)
Creatinine, Ser: 1.03 mg/dL (ref 0.40–1.50)
GFR: 80.46 mL/min (ref >60.00)
Glucose, Bld: 188 mg/dL — ABNORMAL HIGH (ref 70–99)
Potassium: 4.3 mEq/L (ref 3.5–5.1)
Sodium: 140 mEq/L (ref 135–145)
Total Bilirubin: 0.4 mg/dL (ref 0.2–1.2)
Total Protein: 6.6 g/dL (ref 6.0–8.3)

## 2023-04-03 LAB — HEMOGLOBIN A1C: Hgb A1c MFr Bld: 7 % — ABNORMAL HIGH (ref 4.6–6.5)

## 2023-04-03 NOTE — Assessment & Plan Note (Signed)
Close BP control.  Lipid management. Following with Vascular and Cardiology  Asymptomatic currently

## 2023-04-03 NOTE — Assessment & Plan Note (Signed)
Continue current meds Following with cardiology/vascular Declined recheck today

## 2023-04-03 NOTE — Assessment & Plan Note (Signed)
Blood pressure is at goal for age and co-morbidities.   Recommendations: continue current regimen and specialist follow-up - BP goal <130/80 - monitor and log blood pressures at home - check around the same time each day in a relaxed setting - Limit salt to <2000 mg/day - Follow DASH eating plan (heart healthy diet) - limit alcohol to 2 standard drinks per day for men and 1 per day for women - avoid tobacco products - get at least 2 hours of regular aerobic exercise weekly Patient aware of signs/symptoms requiring further/urgent evaluation. Labs updated today.

## 2023-04-03 NOTE — Assessment & Plan Note (Signed)
Currently on 16 units of insulin in the morning and Metformin 500mg  BID. -Check A1c today. -Continue current regimen and monitor glucose levels with Freestyle Libre.

## 2023-04-03 NOTE — Progress Notes (Signed)
Established Patient Office Visit  Subjective   Patient ID: Patrick [name], male    DOB: 07-24-1965  Age: 57 y.o. MRN: [id number]  Chief Complaint  Patient presents with   Medical Management of Chronic Issues    HPI  Patient is here for follow-up, chronic disease management. He established here in April 2024 after hospitalization for pancreatitis, uncontrolled diabetes, and abdominal aortic dissection.   Hypertension, Dissecting AAA (BP goal <120/80), Hypertriglyceridemia:  - Medications: amlodipine 5 mg BID, carvedilol 25 mg BID, clonidine 0.1 mg BID, hydralazine 100 mg TID, losartan 100 mg daily, spironolactone 25 mg daily, aspirin 81 mg daily, atorvastatin 20 mg daily, fenofibrate 160 mg daily,  - Compliance: good - Checking BP at home: yes - Denies any SOB, recurrent headaches, CP, vision changes, LE edema, dizziness, palpitations, or medication side effects. - Diet: heart healthy, low carb, no alcohol - Exercise: walking daily - 11/01/22 Vascular Follow-up (Dr. Lenell Antu): Repeat CT showed stable AAA (47 mm), but incidentally discovered a stable splenic lesion and central mesenteric lesion (present for some time), suspected to be reactive lymph node - will need to monitor. Also noted indeterminate ground-glass opacity to posterior left lung - recommending 3-6 month non contrast CT and repeat at 18-24 months if stable after initial repeat. Planning for abdominal aortic aneurysm duplex around November 2024. - 01/27/23 Cardiology follow-up with Dr. Jens Som: Echo ordered, no other changes at that time - 03/27/23 Echo: normal LV function  Diabetes: - Checking glucose at home: yes, Libre - Medications: metformin 500 mg daily, Lantus 16 units daily - Compliance: good - Eye exam: UTD, requested  - Foot exam: today - Microalbumin: today - Denies symptoms of hypoglycemia, polyuria, polydipsia, numbness extremities, foot ulcers/trauma, wounds that are not healing, medication side effects  -  Currently avoiding GLP-1 due to recent pancreatitis  Lab Results  Component Value Date   HGBA1C 6.7 (H) 01/02/2023     CTA chest/abd/pelvis 10/31/22 IMPRESSION: 1. Minimal change in the abdominal aortic aneurysm and dissection. Proximal abdominal aorta has minimally enlarged since March 2024. Configuration and extent of the abdominal aortic dissection is stable. Maximum dimension of the abdominal aortic aneurysm is 4.7 cm and stable. 2. Indeterminate ground-glass opacity in the posterior left lung. This area measures up to 1.5 cm. Multiple additional small nodules scattered throughout the lungs. Non-contrast chest CT at 3-6 months is recommended. If the nodules are stable at time of repeat CT, then future CT at 18-24 months (from today's scan) is considered optional for low-risk patients, but is recommended for high-risk patients. This recommendation follows the consensus statement: Guidelines for Management of Incidental Pulmonary Nodules Detected on CT Images: From the Fleischner Society 2017; Radiology 2017; 284:228-243. 3. **An incidental finding of potential clinical significance has been found. Slowly enlarging low-density structure in the central mesentery near the proximal jejunum and ligament of Treitz. In retrospect, this structure was present in 2018 and it has slowly enlarged. This structure measures up to 2.5 cm. The minimal growth since 2018 is reassuring suggesting this could represent a benign etiology such as a reactive lymph node or atypical mesenteric or duplication cyst. However, an indolent neoplastic process cannot be excluded. Recommend continued surveillance of this area versus further characterization with a PET-CT.** 4. Cholelithiasis. 5. Slowly enlarging low-density structure in the spleen. This is likely a benign etiology. 6. Celiac trunk is mildly dilated with a slightly beaded configuration. Findings could represent underlying  fibromuscular dysplasia.  He does report a history of smoking about  1 pack a week for maybe 12 years. Quit about 2004.      ROS All review of systems negative except what is listed in the HPI    Objective:     BP 105/61   Pulse 62   Ht 5\' 10"  (1.778 m)   Wt 230 lb (104.3 kg)   SpO2 97%   BMI 33.00 kg/m    Physical Exam Vitals reviewed.  Constitutional:      Appearance: Normal appearance.  Cardiovascular:     Rate and Rhythm: Normal rate and regular rhythm.     Pulses: Normal pulses.     Heart sounds: Normal heart sounds.  Pulmonary:     Effort: Pulmonary effort is normal.     Breath sounds: Normal breath sounds.  Musculoskeletal:     Right lower leg: No edema.     Left lower leg: No edema.  Skin:    General: Skin is warm and dry.  Neurological:     Mental Status: He is alert and oriented to person, place, and time.  Psychiatric:        Mood and Affect: Mood normal.        Behavior: Behavior normal.        Thought Content: Thought content normal.        Judgment: Judgment normal.      No results found for any visits on 04/03/23.    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  Lipid management. Following with Vascular and Cardiology  Asymptomatic currently       Essential hypertension (Chronic)    Blood pressure is at goal for age and co-morbidities.   Recommendations: continue current regimen and specialist follow-up - BP goal <130/80 - monitor and log blood pressures at home - check around the same time each day in a relaxed setting - Limit salt to <2000 mg/day - Follow DASH eating plan (heart healthy diet) - limit alcohol to 2 standard drinks per day for men and 1 per  day for women - avoid tobacco products - get at least 2 hours of regular aerobic exercise weekly Patient aware of signs/symptoms requiring further/urgent evaluation. Labs updated today.       Type 2 diabetes mellitus with hyperglycemia, with long-term current use of insulin (HCC) - Primary (Chronic)    Currently on 16 units of insulin in the morning and Metformin 500mg  BID. -Check A1c today. -Continue current regimen and monitor glucose levels with Freestyle Libre.      Relevant Orders   Hemoglobin A1c   Comprehensive metabolic panel   Hypertriglyceridemia (Chronic)    Continue current meds Following with cardiology/vascular Declined recheck today       Other Visit Diagnoses     Testicular abnormality         Briefly mentions mild testicular swelling, no pain or other symptoms. Planning to reach out to his former urologist, but will let us know if he needs a new referral. Advised on symptoms to monitor for and when to return for exam/imaging while waiting to get in with urology.    Return in about 3 months (around 07/03/2023) for routine follow-up.  Clayborne Dana, NP

## 2023-04-04 ENCOUNTER — Encounter: Payer: Self-pay | Admitting: Family Medicine

## 2023-04-04 ENCOUNTER — Other Ambulatory Visit: Payer: Self-pay | Admitting: Family Medicine

## 2023-04-04 DIAGNOSIS — Z794 Long term (current) use of insulin: Secondary | ICD-10-CM

## 2023-04-04 MED ORDER — METFORMIN HCL ER 500 MG PO TB24: ORAL_TABLET | ORAL | 3 refills | Status: DC

## 2023-04-14 ENCOUNTER — Other Ambulatory Visit: Payer: Self-pay | Admitting: Family Medicine

## 2023-04-14 DIAGNOSIS — I10 Essential (primary) hypertension: Secondary | ICD-10-CM

## 2023-04-14 DIAGNOSIS — I7102 Dissection of abdominal aorta: Secondary | ICD-10-CM

## 2023-04-17 ENCOUNTER — Encounter: Payer: Self-pay | Admitting: Family Medicine

## 2023-04-24 ENCOUNTER — Other Ambulatory Visit: Payer: Self-pay | Admitting: Family Medicine

## 2023-05-08 NOTE — Progress Notes (Unsigned)
[facility] [place]  ASSESSMENT / PLAN: 57 y.o. [name] with dissecting abdominal aortic aneurysm found during hospitalization for acute pancreatitis.  This has been stable over serial exams.  Counseled him that we would not recommend repair until aneurysm component reaches 55 mm.  We will need to monitor the incidental findings on his CT scan.  I will copy my note over to his primary care physician so they are aware as well.  I will see him again in 6 months with an abdominal aortic aneurysm duplex.  CHIEF COMPLAINT: Hospital follow-up  HISTORY OF PRESENT ILLNESS: Patrick [name] is a 57 y.o. [name] who returns to clinic for evaluation after hospitalization for acute pancreatitis.  Discovered during his workup was a dissecting abdominal aortic aneurysm involving the entire abdominal aorta.  The largest aneurysmal segment was 47 mm.  It was not clear to me at that time whether his dissection was acute or chronic.  In retrospect I think it was likely chronic.  He continues to struggle with high blood pressure, but has is under reasonable control.  He is stopped drinking altogether.  He feels better than he ever has.  He is working as he likes to.   Past Medical History:  Diagnosis Date   AAA (abdominal aortic aneurysm) (HCC)    Aortic dissection (HCC)    Diabetes mellitus (HCC)    Hyperlipidemia    Hypertension    Kidney stone    Nosebleed     Past Surgical History:  Procedure Laterality Date   HERNIA REPAIR      Family History  Problem Relation Age of Onset   Hypertension Mother     Social History   Socioeconomic History   Marital status: Married    Spouse name: Not on file   Number of children: 4   Years of education: Not on file   Highest education level: Not on file  Occupational History   Not on file  Tobacco Use   Smoking status: Former   Smokeless tobacco: Never  Substance and Sexual Activity   Alcohol use: Not Currently    Comment: daily   Drug  use: Yes    Types: Marijuana   Sexual activity: Not on file  Other Topics Concern   Not on file  Social History Narrative   Not on file   Social Determinants of Health   Financial Resource Strain: Low Risk  (09/23/2022)   Overall Financial Resource Strain (CARDIA)    Difficulty of Paying Living Expenses: Not hard at all  Food Insecurity: No Food Insecurity (09/23/2022)   Hunger Vital Sign    Worried About Running Out of Food in the Last Year: Never true    Ran Out of Food in the Last Year: Never true  Transportation Needs: No Transportation Needs (09/23/2022)   PRAPARE - Administrator, Civil Service (Medical): No    Lack of Transportation (Non-Medical): No  Physical Activity: Not on file  Stress: Not on file  Social Connections: Not on file  Intimate Partner Violence: Not At Risk (09/23/2022)   Humiliation, Afraid, Rape, and Kick questionnaire    Fear of Current or Ex-Partner: No    Emotionally Abused: No    Physically Abused: No    Sexually Abused: No    No Known Allergies  Current Outpatient Medications  Medication Sig Dispense Refill   acetaminophen (TYLENOL) 500 MG tablet Take 500-1,000 mg by mouth every 6 (six) hours as needed for moderate pain.  amLODipine (NORVASC) 5 MG tablet Take 1 tablet (5 mg total) by mouth in the morning and at bedtime. 180 tablet 1   aspirin 81 MG chewable tablet Chew 325 mg by mouth daily as needed (Chest Pain/Pain).     atorvastatin (LIPITOR) 20 MG tablet TAKE 1 TABLET BY MOUTH DAILY 90 tablet 1   b complex vitamins capsule Take 1 capsule by mouth daily.     carvedilol (COREG) 25 MG tablet Take 1 tablet (25 mg total) by mouth 2 (two) times daily with a meal. 1 tablet in the morning, 2 tablets in the evening 180 tablet 1   cloNIDine (CATAPRES) 0.1 MG tablet TAKE 1 TABLET BY MOUTH 2 TIMES A DAY 180 tablet 1   Continuous Glucose Sensor (FREESTYLE LIBRE 3 SENSOR) MISC APPLY SENSOR TO SKIN FOR CONTINUOUS BLOOD SUGAR MONITORING, REPLACE  EVERY 14 DAYS 6 each 1   fenofibrate 160 MG tablet TAKE 1 TABLET BY MOUTH DAILY 90 tablet 1   hydrALAZINE (APRESOLINE) 100 MG tablet TAKE ONE TABLET BY MOUTH EVERY 8 HOURS 270 tablet 1   insulin glargine-yfgn (SEMGLEE) 100 UNIT/ML Pen INJECT 16 UNITS INTO THE SKIN ONCE DAILY 6 mL 5   Insulin Pen Needle (PEN NEEDLES) 32G X 4 MM MISC 1 each by Does not apply route daily. To use with insulin 100 each 4   losartan (COZAAR) 100 MG tablet TAKE 1 TABLET BY MOUTH DAILY 90 tablet 1   metFORMIN (GLUCOPHAGE-XR) 500 MG 24 hr tablet 500 mg (1 tablet) every morning and 1,000 mg (2 tablets) every evening. 90 tablet 3   Multiple Vitamin (MULTIVITAMIN WITH MINERALS) TABS tablet Take 1 tablet by mouth daily.     psyllium (METAMUCIL) 58.6 % packet Take 1 packet by mouth daily.     sildenafil (VIAGRA) 50 MG tablet Take 1 tablet (50 mg total) by mouth as needed for erectile dysfunction. 10 tablet 5   spironolactone (ALDACTONE) 25 MG tablet Take 1 tablet (25 mg total) by mouth daily. 90 tablet 1   No current facility-administered medications for this visit.    PHYSICAL EXAM There were no vitals filed for this visit.  Well-appearing man in no distress Regular rate and rhythm Unlabored breathing Soft nontender abdomen Easily palpable dorsalis pedis pulses Trace edema about the ankles bilaterally   PERTINENT LABORATORY AND RADIOLOGIC DATA  Most recent CBC    Latest Ref Rng & Units 09/20/2022   12:35 AM 09/19/2022    5:51 AM 09/18/2022   11:47 PM  CBC  WBC 4.0 - 10.5 K/uL 10.3  10.4  12.5   Hemoglobin 13.0 - 17.0 g/dL 29.5  62.1  30.8   Hematocrit 39.0 - 52.0 % 39.2  39.8  44.3   Platelets 150 - 400 K/uL 153  170  212      Most recent CMP    Latest Ref Rng & Units 04/03/2023    9:43 AM 11/15/2022   10:57 AM 09/24/2022    1:56 AM  CMP  Glucose 70 - 99 mg/dL 657  846  962   BUN 6 - 23 mg/dL 22  21  22    Creatinine 0.40 - 1.50 mg/dL 9.52  8.41  3.24   Sodium 135 - 145 mEq/L 140  138  134   Potassium  3.5 - 5.1 mEq/L 4.3  4.1  4.0   Chloride 96 - 112 mEq/L 109  106  104   CO2 19 - 32 mEq/L 22  24  23    Calcium  8.4 - 10.5 mg/dL 9.5  9.4  8.9   Total Protein 6.0 - 8.3 g/dL 6.6  6.9    Total Bilirubin 0.2 - 1.2 mg/dL 0.4  0.4    Alkaline Phos 39 - 117 U/L 72  61    AST 0 - 37 U/L 15  21    ALT 0 - 53 U/L 18  38      Renal function CrCl cannot be calculated (Patient's most recent lab result is older than the maximum 21 days allowed.).  Hgb A1c MFr Bld (%)  Date Value  04/03/2023 7.0 (H)    LDL Cholesterol  Date Value Ref Range Status  09/18/2022 UNABLE TO CALCULATE IF TRIGLYCERIDE OVER 400 mg/dL 0 - 99 mg/dL Final    Comment:    Performed at [facility], [address], [place], [place] [address]   Direct LDL  Date Value Ref Range Status  11/15/2022 53.0 mg/dL Final    Comment:    Optimal:  <100 mg/dLNear or Above Optimal:  100-129 mg/dLBorderline High:  130-159 mg/dLHigh:  160-189 mg/dLVery High:  >190 mg/dL    CT angiogram personally reviewed in detail. Overall stable appearance of the abdominal aorta with 47 mm infrarenal abdominal aortic aneurysm and perivisceral and pararenal dissection. Incidentally discovered stable splenic lesion and central mesenteric lesion which has been present for some time.  Attention will need to be paid to this in follow-up.  I suspect this is a reactive lymph node.  Groundglass opacities also noted along which will need attention and follow-up.   Rande Brunt. Lenell Antu, MD FACS [facility] Phone Number: [phone] 05/08/2023 9:56 AM   Total time spent on preparing this encounter including chart review, data review, collecting history, examining the patient, coordinating care for this established patient, 30 minutes.  Portions of this report may have been transcribed using voice recognition software.  Every effort has been made to ensure accuracy; however, inadvertent computerized transcription  errors may still be present.

## 2023-05-09 ENCOUNTER — Ambulatory Visit (HOSPITAL_COMMUNITY)
Admission: RE | Admit: 2023-05-09 | Discharge: 2023-05-09 | Disposition: A | Discharge status: Home / Self Care | Payer: BC Managed Care – PPO | Source: Ambulatory Visit | Attending: Vascular Surgery | Admitting: Vascular Surgery

## 2023-05-09 ENCOUNTER — Encounter: Payer: Self-pay | Admitting: Vascular Surgery

## 2023-05-09 ENCOUNTER — Ambulatory Visit (INDEPENDENT_AMBULATORY_CARE_PROVIDER_SITE_OTHER): Payer: BC Managed Care – PPO | Admitting: Vascular Surgery

## 2023-05-09 VITALS — BP 132/79 | HR 54 | Temp 97.8°F | Resp 20 | Ht 70.0 in | Wt 231.0 lb

## 2023-05-09 DIAGNOSIS — I7102 Dissection of abdominal aorta: Secondary | ICD-10-CM

## 2023-05-24 ENCOUNTER — Other Ambulatory Visit: Payer: Self-pay

## 2023-05-24 DIAGNOSIS — I7102 Dissection of abdominal aorta: Secondary | ICD-10-CM

## 2023-05-29 ENCOUNTER — Encounter: Payer: Self-pay | Admitting: Family Medicine

## 2023-05-29 DIAGNOSIS — R911 Solitary pulmonary nodule: Secondary | ICD-10-CM

## 2023-05-29 DIAGNOSIS — R9389 Abnormal findings on diagnostic imaging of other specified body structures: Secondary | ICD-10-CM

## 2023-06-02 ENCOUNTER — Telehealth (HOSPITAL_BASED_OUTPATIENT_CLINIC_OR_DEPARTMENT_OTHER): Payer: Self-pay | Admitting: Family Medicine

## 2023-06-02 ENCOUNTER — Other Ambulatory Visit: Payer: Self-pay | Admitting: Family Medicine

## 2023-06-02 DIAGNOSIS — I10 Essential (primary) hypertension: Secondary | ICD-10-CM

## 2023-06-12 ENCOUNTER — Ambulatory Visit (HOSPITAL_BASED_OUTPATIENT_CLINIC_OR_DEPARTMENT_OTHER)
Admission: RE | Admit: 2023-06-12 | Discharge: 2023-06-12 | Disposition: A | Discharge status: Home / Self Care | Payer: BC Managed Care – PPO | Source: Ambulatory Visit | Attending: Family Medicine | Admitting: Family Medicine

## 2023-06-12 DIAGNOSIS — R911 Solitary pulmonary nodule: Secondary | ICD-10-CM | POA: Insufficient documentation

## 2023-06-12 DIAGNOSIS — R918 Other nonspecific abnormal finding of lung field: Secondary | ICD-10-CM | POA: Diagnosis not present

## 2023-06-12 DIAGNOSIS — R9389 Abnormal findings on diagnostic imaging of other specified body structures: Secondary | ICD-10-CM | POA: Insufficient documentation

## 2023-06-12 DIAGNOSIS — J984 Other disorders of lung: Secondary | ICD-10-CM | POA: Diagnosis not present

## 2023-06-26 ENCOUNTER — Ambulatory Visit (INDEPENDENT_AMBULATORY_CARE_PROVIDER_SITE_OTHER): Payer: BC Managed Care – PPO | Admitting: Family Medicine

## 2023-06-26 VITALS — BP 127/73 | HR 67 | Ht 70.0 in | Wt 231.0 lb

## 2023-06-26 DIAGNOSIS — I7102 Dissection of abdominal aorta: Secondary | ICD-10-CM | POA: Diagnosis not present

## 2023-06-26 DIAGNOSIS — R911 Solitary pulmonary nodule: Secondary | ICD-10-CM | POA: Diagnosis not present

## 2023-06-26 DIAGNOSIS — E781 Pure hyperglyceridemia: Secondary | ICD-10-CM

## 2023-06-26 DIAGNOSIS — Z794 Long term (current) use of insulin: Secondary | ICD-10-CM

## 2023-06-26 DIAGNOSIS — I10 Essential (primary) hypertension: Secondary | ICD-10-CM

## 2023-06-26 DIAGNOSIS — R9389 Abnormal findings on diagnostic imaging of other specified body structures: Secondary | ICD-10-CM

## 2023-06-26 DIAGNOSIS — E1165 Type 2 diabetes mellitus with hyperglycemia: Secondary | ICD-10-CM

## 2023-06-26 NOTE — Assessment & Plan Note (Signed)
Close BP control.  Lipid management. Following with Vascular and Cardiology - good report in November, planning to see vascular again in a year. Due for cardiology follow-up.  Asymptomatic currently

## 2023-06-26 NOTE — Assessment & Plan Note (Signed)
Blood pressure is at goal for age and co-morbidities.   Recommendations: continue current regimen and specialist follow-up - BP goal <130/80 - monitor and log blood pressures at home - check around the same time each day in a relaxed setting - Limit salt to <2000 mg/day - Follow DASH eating plan (heart healthy diet) - limit alcohol to 2 standard drinks per day for men and 1 per day for women - avoid tobacco products - get at least 2 hours of regular aerobic exercise weekly Patient aware of signs/symptoms requiring further/urgent evaluation. Labs ordered

## 2023-06-26 NOTE — Assessment & Plan Note (Signed)
Lungs stable on repeat chest CT - plan to follow-up with scan in December 2026 For previous mesentery finding: discussed with Dr. Molli Posey (radiology) - likely benign given slow progression over the past 6+ years. Reasonable to do CT Abd WO Contrast in about 6 months (Spring/Summer 2025) to check for stability.

## 2023-06-26 NOTE — Assessment & Plan Note (Signed)
Currently on 16 units of insulin in the morning and Metformin 500mg  in the morning and 1,000 mg in the evening -Check A1c  -Continue current regimen and monitor glucose levels with Freestyle Libre. -Continue lifestyle measures

## 2023-06-26 NOTE — Assessment & Plan Note (Signed)
Continue current meds Following with cardiology/vascular Labs ordered

## 2023-06-26 NOTE — Progress Notes (Signed)
Established Patient Office Visit  Subjective   Patient ID: Patrick [name], male    DOB: November 03, 1965  Age: 57 y.o. MRN: [id number]  Chief Complaint  Patient presents with   Medical Management of Chronic Issues   Diabetes      Patient is here for follow-up, chronic disease management. He established here in April 2024 after hospitalization for pancreatitis, uncontrolled diabetes, and abdominal aortic dissection.   Hypertension, Dissecting AAA (BP goal <120/80), Hypertriglyceridemia:  - Medications: amlodipine 5 mg BID, carvedilol 25 mg BID, clonidine 0.1 mg BID, hydralazine 100 mg TID, losartan 100 mg daily, spironolactone 25 mg daily, aspirin 81 mg daily, atorvastatin 20 mg daily, fenofibrate 160 mg daily,  - Compliance: good - Checking BP at home: yes, fluctuates - Denies any SOB, recurrent headaches, CP, vision changes, LE edema, dizziness, palpitations, or medication side effects. - Diet: heart healthy, low carb, no alcohol - Exercise: walking daily - 11/01/22 Vascular Follow-up (Dr. Lenell Antu): Repeat CT showed stable AAA (47 mm), but incidentally discovered a stable splenic lesion and central mesenteric lesion (present for some time), suspected to be reactive lymph node - will need to monitor. Also noted indeterminate ground-glass opacity to posterior left lung - recommending 3-6 month non contrast CT and repeat at 18-24 months if stable after initial repeat. Planning for abdominal aortic aneurysm duplex around November 2024. - 01/27/23 Cardiology follow-up with Dr. Jens Som: Echo ordered, no other changes at that time - 03/27/23 Echo: normal LV function  Diabetes: - Checking glucose at home: yes, Libre (70& in range, had some highs around Thanksgiving)  - Medications: metformin 500 mg in the morning 1,000 in the evening, Lantus 16 units daily - Compliance: good - Eye exam: UTD, requested  - Foot exam: today - Microalbumin: today - Denies symptoms of hypoglycemia, polyuria, polydipsia,  numbness extremities, foot ulcers/trauma, wounds that are not healing, medication side effects  - Currently avoiding GLP-1 due to recent pancreatitis  Lab Results  Component Value Date   HGBA1C 7.0 (H) 04/03/2023     CTA chest/abd/pelvis 10/31/22 IMPRESSION: 1. Minimal change in the abdominal aortic aneurysm and dissection. Proximal abdominal aorta has minimally enlarged since March 2024. Configuration and extent of the abdominal aortic dissection is stable. Maximum dimension of the abdominal aortic aneurysm is 4.7 cm and stable. 2. Indeterminate ground-glass opacity in the posterior left lung. This area measures up to 1.5 cm. Multiple additional small nodules scattered throughout the lungs. Non-contrast chest CT at 3-6 months is recommended. If the nodules are stable at time of repeat CT, then future CT at 18-24 months (from today's scan) is considered optional for low-risk patients, but is recommended for high-risk patients. This recommendation follows the consensus statement: Guidelines for Management of Incidental Pulmonary Nodules Detected on CT Images: From the Fleischner Society 2017; Radiology 2017; 284:228-243. 3. **An incidental finding of potential clinical significance has been found. Slowly enlarging low-density structure in the central mesentery near the proximal jejunum and ligament of Treitz. In retrospect, this structure was present in 2018 and it has slowly enlarged. This structure measures up to 2.5 cm. The minimal growth since 2018 is reassuring suggesting this could represent a benign etiology such as a reactive lymph node or atypical mesenteric or duplication cyst. However, an indolent neoplastic process cannot be excluded. Recommend continued surveillance of this area versus further characterization with a PET-CT.** 4. Cholelithiasis. 5. Slowly enlarging low-density structure in the spleen. This is likely a benign etiology. 6. Celiac trunk is mildly dilated  with  a slightly beaded configuration. Findings could represent underlying fibromuscular dysplasia.    06/12/2023 CT chest wo contrast IMPRESSION: 1. Stable area of poorly defined ground-glass with central cavitation in the subpleural superior segment of the left lower lobe. This exam constitutes 8 months of imaging stability. Recommend additional CT every 2 years until 5 years of imaging stability is documented. 2. Additional small pulmonary nodules are unchanged. No new or enlarging pulmonary nodules. These can be reassessed on follow-up. 3. Coronary artery calcifications. Aortic Atherosclerosis (ICD10-I70.0). 4. Small hiatal hernia.    He does report a history of smoking about 1 pack a week for maybe 12 years. Quit about 2004.        ROS All review of systems negative except what is listed in the HPI    Objective:     BP 127/73   Pulse 67   Ht 5\' 10"  (1.778 m)   Wt 231 lb (104.8 kg)   SpO2 98%   BMI 33.15 kg/m    Physical Exam Vitals reviewed.  Constitutional:      Appearance: Normal appearance.  Cardiovascular:     Rate and Rhythm: Normal rate and regular rhythm.     Pulses: Normal pulses.     Heart sounds: Normal heart sounds.  Pulmonary:     Effort: Pulmonary effort is normal.     Breath sounds: Normal breath sounds.  Musculoskeletal:     Right lower leg: No edema.     Left lower leg: No edema.  Skin:    General: Skin is warm and dry.  Neurological:     Mental Status: He is alert and oriented to person, place, and time.  Psychiatric:        Mood and Affect: Mood normal.        Behavior: Behavior normal.        Thought Content: Thought content normal.        Judgment: Judgment normal.      No results found for any visits on 06/26/23.    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  Lipid management. Following with Vascular and Cardiology - good report in November, planning to see vascular again in a year. Due for cardiology follow-up.  Asymptomatic currently       Relevant Orders   Comprehensive metabolic panel   Lipid panel   Essential hypertension (Chronic)   Blood pressure is at goal for age and co-morbidities.   Recommendations: continue current regimen and specialist follow-up - BP goal <130/80 - monitor and log blood pressures at home - check around the same time each day in a relaxed setting - Limit salt to <2000 mg/day - Follow DASH eating plan (heart healthy diet) - limit alcohol to 2 standard drinks per day for men and 1 per day for women - avoid tobacco products - get at least 2 hours of regular aerobic exercise weekly Patient aware of signs/symptoms requiring further/urgent evaluation. Labs ordered       Relevant Orders   CBC with Differential/Platelet   Comprehensive metabolic panel   Lipid panel   TSH   Type 2 diabetes mellitus with hyperglycemia, with long-term current use of  insulin (HCC) - Primary (Chronic)   Currently on 16 units of insulin in the morning and Metformin 500mg  in the morning and 1,000 mg in the evening -Check A1c  -Continue current regimen and monitor glucose levels with Freestyle Libre. -Continue lifestyle measures       Relevant Orders   Hemoglobin A1c   CBC with Differential/Platelet   Comprehensive metabolic panel   Lipid panel   Abnormal CT scan (Chronic)   Lungs stable on repeat chest CT - plan to follow-up with scan in December 2026 For previous mesentery finding: discussed with Dr. Molli Posey (radiology) - likely benign given slow progression over the past 6+ years. Reasonable to do CT Abd WO Contrast in about 6 months (Spring/Summer 2025) to check for stability.       Lung nodule  seen on imaging study (Chronic)   Stable on recent CT scan. -Next CT scan due in December 2026.      Hypertriglyceridemia (Chronic)   Continue current meds Following with cardiology/vascular Labs ordered      Relevant Orders   Lipid panel     Return in about 6 months (around 12/25/2023) for routine follow-up.    Clayborne Dana, NP

## 2023-06-26 NOTE — Assessment & Plan Note (Signed)
Stable on recent CT scan. -Next CT scan due in December 2026.

## 2023-06-29 MED ORDER — FREESTYLE LIBRE 3 SENSOR MISC: 3 refills | Status: DC

## 2023-06-29 NOTE — Addendum Note (Signed)
Addended byConrad Escondido D on: 06/29/2023 12:55 PM   Modules accepted: Orders

## 2023-07-10 ENCOUNTER — Other Ambulatory Visit (INDEPENDENT_AMBULATORY_CARE_PROVIDER_SITE_OTHER): Payer: BC Managed Care – PPO

## 2023-07-10 DIAGNOSIS — Z794 Long term (current) use of insulin: Secondary | ICD-10-CM

## 2023-07-10 DIAGNOSIS — I7102 Dissection of abdominal aorta: Secondary | ICD-10-CM

## 2023-07-10 DIAGNOSIS — E1165 Type 2 diabetes mellitus with hyperglycemia: Secondary | ICD-10-CM

## 2023-07-10 DIAGNOSIS — E781 Pure hyperglyceridemia: Secondary | ICD-10-CM

## 2023-07-10 DIAGNOSIS — I10 Essential (primary) hypertension: Secondary | ICD-10-CM

## 2023-07-10 LAB — CBC WITH DIFFERENTIAL/PLATELET
Basophils Absolute: 0 10*3/uL (ref 0.0–0.1)
Basophils Relative: 0.3 % (ref 0.0–3.0)
Eosinophils Absolute: 0.2 10*3/uL (ref 0.0–0.7)
Eosinophils Relative: 2.1 % (ref 0.0–5.0)
HCT: 42.4 % (ref 39.0–52.0)
Hemoglobin: 14.5 g/dL (ref 13.0–17.0)
Lymphocytes Relative: 24.3 % (ref 12.0–46.0)
Lymphs Abs: 2.3 10*3/uL (ref 0.7–4.0)
MCHC: 34.1 g/dL (ref 30.0–36.0)
MCV: 86.3 fL (ref 78.0–100.0)
Monocytes Absolute: 0.7 10*3/uL (ref 0.1–1.0)
Monocytes Relative: 7.7 % (ref 3.0–12.0)
Neutro Abs: 6.1 10*3/uL (ref 1.4–7.7)
Neutrophils Relative %: 65.6 % (ref 43.0–77.0)
Platelets: 192 10*3/uL (ref 150.0–400.0)
RBC: 4.91 Mil/uL (ref 4.22–5.81)
RDW: 13.5 % (ref 11.5–15.5)
WBC: 9.4 10*3/uL (ref 4.0–10.5)

## 2023-07-10 LAB — HEMOGLOBIN A1C: Hgb A1c MFr Bld: 7.3 % — ABNORMAL HIGH (ref 4.6–6.5)

## 2023-07-10 LAB — COMPREHENSIVE METABOLIC PANEL
ALT: 22 U/L (ref 0–53)
AST: 18 U/L (ref 0–37)
Albumin: 4.7 g/dL (ref 3.5–5.2)
Alkaline Phosphatase: 61 U/L (ref 39–117)
BUN: 22 mg/dL (ref 6–23)
CO2: 27 meq/L (ref 19–32)
Calcium: 9.5 mg/dL (ref 8.4–10.5)
Chloride: 106 meq/L (ref 96–112)
Creatinine, Ser: 1.04 mg/dL (ref 0.40–1.50)
GFR: 79.39 mL/min (ref >60.00)
Glucose, Bld: 158 mg/dL — ABNORMAL HIGH (ref 70–99)
Potassium: 4.3 meq/L (ref 3.5–5.1)
Sodium: 143 meq/L (ref 135–145)
Total Bilirubin: 0.5 mg/dL (ref 0.2–1.2)
Total Protein: 7 g/dL (ref 6.0–8.3)

## 2023-07-10 LAB — LIPID PANEL
Cholesterol: 136 mg/dL (ref 0–200)
HDL: 26.9 mg/dL — ABNORMAL LOW (ref >39.00)
LDL Cholesterol: 61 mg/dL (ref 0–99)
NonHDL: 109.45
Total CHOL/HDL Ratio: 5
Triglycerides: 242 mg/dL — ABNORMAL HIGH (ref 0.0–149.0)
VLDL: 48.4 mg/dL — ABNORMAL HIGH (ref 0.0–40.0)

## 2023-07-10 LAB — TSH: TSH: 2.18 u[IU]/mL (ref 0.35–5.50)

## 2023-07-13 ENCOUNTER — Encounter: Payer: Self-pay | Admitting: Family Medicine

## 2023-07-29 ENCOUNTER — Other Ambulatory Visit: Payer: Self-pay | Admitting: Family Medicine

## 2023-07-29 DIAGNOSIS — E1165 Type 2 diabetes mellitus with hyperglycemia: Secondary | ICD-10-CM

## 2023-08-09 LAB — HM DIABETES EYE EXAM

## 2023-08-11 ENCOUNTER — Encounter: Payer: Self-pay | Admitting: Neurology

## 2023-08-27 ENCOUNTER — Other Ambulatory Visit: Payer: Self-pay | Admitting: Family Medicine

## 2023-08-27 DIAGNOSIS — I10 Essential (primary) hypertension: Secondary | ICD-10-CM

## 2023-08-28 ENCOUNTER — Encounter: Payer: Self-pay | Admitting: Family Medicine

## 2023-08-28 DIAGNOSIS — N5089 Other specified disorders of the male genital organs: Secondary | ICD-10-CM

## 2023-08-29 NOTE — Telephone Encounter (Signed)
 Pended.

## 2023-09-09 ENCOUNTER — Other Ambulatory Visit: Payer: Self-pay | Admitting: Family Medicine

## 2023-09-09 DIAGNOSIS — Z794 Long term (current) use of insulin: Secondary | ICD-10-CM

## 2023-09-26 ENCOUNTER — Telehealth (HOSPITAL_BASED_OUTPATIENT_CLINIC_OR_DEPARTMENT_OTHER): Payer: Self-pay

## 2023-09-26 ENCOUNTER — Encounter: Payer: Self-pay | Admitting: Urology

## 2023-09-26 ENCOUNTER — Ambulatory Visit (INDEPENDENT_AMBULATORY_CARE_PROVIDER_SITE_OTHER): Payer: BC Managed Care – PPO | Admitting: Urology

## 2023-09-26 VITALS — BP 173/94 | HR 59 | Ht 70.0 in | Wt 225.0 lb

## 2023-09-26 DIAGNOSIS — N5089 Other specified disorders of the male genital organs: Secondary | ICD-10-CM

## 2023-09-26 LAB — URINALYSIS, ROUTINE W REFLEX MICROSCOPIC
Bilirubin, UA: NEGATIVE
Glucose, UA: NEGATIVE
Ketones, UA: NEGATIVE
Leukocytes,UA: NEGATIVE
Nitrite, UA: NEGATIVE
Protein,UA: NEGATIVE
RBC, UA: NEGATIVE
Specific Gravity, UA: 1.02 (ref 1.005–1.030)
Urobilinogen, Ur: 0.2 mg/dL (ref 0.2–1.0)
pH, UA: 6 (ref 5.0–7.5)

## 2023-09-26 NOTE — Progress Notes (Signed)
 Assessment: 1. Scrotal swelling, left     Plan: His exam is consistent with a left hydrocele. Recommend further evaluation with a scrotal ultrasound with Doppler. Will contact him with results when available and to discuss additional management.  Chief Complaint:  Chief Complaint  Patient presents with   Groin Swelling    History of Present Illness:  Patrick [name] is a 58 y.o. male who is seen in consultation from Hyman Hopes B, NP for evaluation of left scrotal swelling.  He noted onset of left scrotal enlargement approximately 6 months ago.  This is gradually increased in size.  He is not having any pain associated with this.  No history of scrotal infection or trauma.  No recent imaging. He does not have any significant lower urinary tract symptoms other than occasional frequency.  No dysuria or gross hematuria. IPSS = 8.  No recent kidney stone symptoms.  Past Medical History:  Past Medical History:  Diagnosis Date   AAA (abdominal aortic aneurysm) (HCC)    Aortic dissection (HCC)    Diabetes mellitus (HCC)    Hyperlipidemia    Hypertension    Kidney stone    Nosebleed     Past Surgical History:  Past Surgical History:  Procedure Laterality Date   CYSTOSCOPY     HERNIA REPAIR     KIDNEY STONE SURGERY     LITHOTRIPSY      Allergies:  No Known Allergies  Family History:  Family History  Problem Relation Age of Onset   Hypertension Mother     Social History:  Social History   Tobacco Use   Smoking status: Former   Smokeless tobacco: Never  Substance Use Topics   Alcohol use: Not Currently    Comment: daily   Drug use: Yes    Types: Marijuana    Review of symptoms:  Constitutional:  Negative for unexplained weight loss, night sweats, fever, chills ENT:  Negative for nose bleeds, sinus pain, painful swallowing CV:  Negative for chest pain, shortness of breath, exercise intolerance, palpitations, loss of consciousness Resp:  Negative for cough,  wheezing, shortness of breath GI:  Negative for nausea, vomiting, diarrhea, bloody stools GU:  Positives noted in HPI; otherwise negative for gross hematuria, dysuria, urinary incontinence Neuro:  Negative for seizures, poor balance, limb weakness, slurred speech Psych:  Negative for lack of energy, depression, anxiety Endocrine:  Negative for polydipsia, polyuria, symptoms of hypoglycemia (dizziness, hunger, sweating) Hematologic:  Negative for anemia, purpura, petechia, prolonged or excessive bleeding, use of anticoagulants  Allergic:  Negative for difficulty breathing or choking as a result of exposure to anything; no shellfish allergy; no allergic response (rash/itch) to materials, foods  Physical exam: BP (!) 173/94   Pulse (!) 59   Ht 5\' 10"  (1.778 m)   Wt 225 lb (102.1 kg)   BMI 32.28 kg/m  GENERAL APPEARANCE:  Well appearing, well developed, well nourished, NAD HEENT: Atraumatic, Normocephalic, oropharynx clear. NECK: Supple without lymphadenopathy or thyromegaly. LUNGS: Clear to auscultation bilaterally. HEART: Regular Rate and Rhythm without murmurs, gallops, or rubs. ABDOMEN: Soft, non-tender, No Masses. EXTREMITIES: Moves all extremities well.  Without clubbing, cyanosis, or edema. NEUROLOGIC:  Alert and oriented x 3, normal gait, CN II-XII grossly intact.  MENTAL STATUS:  Appropriate. BACK:  Non-tender to palpation.  No CVAT SKIN:  Warm, dry and intact.   GU: Penis:  uncircumcised Meatus: Normal Scrotum: Enlargement of the left scrotum; no erythema; exam consistent with hydrocele; no obvious hernia palpated Testis:  Right normal; unable to palpate left   Results: U/A: Negative

## 2023-09-27 ENCOUNTER — Telehealth (HOSPITAL_BASED_OUTPATIENT_CLINIC_OR_DEPARTMENT_OTHER): Payer: Self-pay

## 2023-09-29 ENCOUNTER — Ambulatory Visit (HOSPITAL_BASED_OUTPATIENT_CLINIC_OR_DEPARTMENT_OTHER)
Admission: RE | Admit: 2023-09-29 | Discharge: 2023-09-29 | Disposition: A | Discharge status: Home / Self Care | Source: Ambulatory Visit | Attending: Urology | Admitting: Urology

## 2023-09-29 DIAGNOSIS — N5089 Other specified disorders of the male genital organs: Secondary | ICD-10-CM | POA: Insufficient documentation

## 2023-09-29 DIAGNOSIS — N433 Hydrocele, unspecified: Secondary | ICD-10-CM | POA: Diagnosis not present

## 2023-10-04 ENCOUNTER — Encounter: Payer: Self-pay | Admitting: Urology

## 2023-10-04 DIAGNOSIS — N433 Hydrocele, unspecified: Secondary | ICD-10-CM

## 2023-10-15 ENCOUNTER — Other Ambulatory Visit: Payer: Self-pay | Admitting: Family Medicine

## 2023-10-15 DIAGNOSIS — I7102 Dissection of abdominal aorta: Secondary | ICD-10-CM

## 2023-10-15 DIAGNOSIS — I10 Essential (primary) hypertension: Secondary | ICD-10-CM

## 2023-10-16 ENCOUNTER — Other Ambulatory Visit: Payer: Self-pay | Admitting: Family Medicine

## 2023-10-16 DIAGNOSIS — I10 Essential (primary) hypertension: Secondary | ICD-10-CM

## 2023-10-27 ENCOUNTER — Ambulatory Visit: Payer: Self-pay | Admitting: Urology

## 2023-10-27 DIAGNOSIS — N433 Hydrocele, unspecified: Secondary | ICD-10-CM

## 2023-11-07 NOTE — Patient Instructions (Addendum)
 SURGICAL WAITING ROOM VISITATION  Patients having surgery or a procedure may have no more than 2 support people in the waiting area - these visitors may rotate.    Children under the age of 29 must have an adult with them who is not the patient.  Due to an increase in RSV and influenza rates and associated hospitalizations, children ages 79 and under may not visit patients in [facility] hospitals.  Visitors with respiratory illnesses are discouraged from visiting and should remain at home.  If the patient needs to stay at the hospital during part of their recovery, the visitor guidelines for inpatient rooms apply. Pre-op nurse will coordinate an appropriate time for 1 support person to accompany patient in pre-op.  This support person may not rotate.    Please refer to the [facility] website for the visitor guidelines for Inpatients (after your surgery is over and you are in a regular room).       Your procedure is scheduled on: 11/21/23   Report to [facility] Main Entrance    Report to admitting at 12:15 PM   Call this number if you have problems the morning of surgery [phone]   Do not eat food or drink liquids:After Midnight.But may have sips of water to take meds.        Oral Hygiene is also important to reduce your risk of infection.                                    Remember - BRUSH YOUR TEETH THE MORNING OF SURGERY WITH YOUR REGULAR TOOTHPASTE   Stop all vitamins and herbal supplements 7 days before surgery.   Take these medicines the morning of surgery with A SIP OF WATER: Amlodipine , atorvastatin , carvedilol , clonidine , fenofibrate , spironolactone .               Do not take the hydralazine  or losartan  the morning of surgery.  . DO NOT TAKE ANY ORAL DIABETIC MEDICATIONS DAY OF YOUR SURGERY Hold metformin  the morning of surgery.             You may not have any metal on your body including hair pins, jewelry, and body piercing             Do not wear  make-up, lotions, powders, perfumes/cologne, or deodorant              Men may shave face and neck.   Do not bring valuables to the hospital. [facility] IS NOT             RESPONSIBLE   FOR VALUABLES.   Contacts, glasses, dentures or bridgework may not be worn into surgery.  DO NOT BRING YOUR HOME MEDICATIONS TO THE HOSPITAL. PHARMACY WILL DISPENSE MEDICATIONS LISTED ON YOUR MEDICATION LIST TO YOU DURING YOUR ADMISSION IN THE HOSPITAL!    Patients discharged on the day of surgery will not be allowed to drive home.  Someone NEEDS to stay with you for the first 24 hours after anesthesia.   Special Instructions: Bring a copy of your healthcare power of attorney and living will documents the day of surgery if you haven't scanned them before.              Please read over the following fact sheets you were given: IF YOU HAVE QUESTIONS ABOUT YOUR PRE-OP INSTRUCTIONS PLEASE CALL [phone] Patrick [name]   If you  received a COVID test during your pre-op visit  it is requested that you wear a mask when out in public, stay away from anyone that may not be feeling well and notify your surgeon if you develop symptoms. If you test positive for Covid or have been in contact with anyone that has tested positive in the last 10 days please notify you surgeon.    [facility] - Preparing for Surgery Before surgery, you can play an important role.  Because skin is not sterile, your skin needs to be as free of germs as possible.  You can reduce the number of germs on your skin by washing with CHG (chlorahexidine gluconate) soap before surgery.  CHG is an antiseptic cleaner which kills germs and bonds with the skin to continue killing germs even after washing. Please DO NOT use if you have an allergy to CHG or antibacterial soaps.  If your skin becomes reddened/irritated stop using the CHG and inform your nurse when you arrive at Short Stay. Do not shave (including legs and underarms) for at least 48 hours prior to  the first CHG shower.  You may shave your face/neck.  Please follow these instructions carefully:  1.  Shower with CHG Soap the night before surgery and the  morning of surgery.  2.  If you choose to wash your hair, wash your hair first as usual with your normal  shampoo.  3.  After you shampoo, rinse your hair and body thoroughly to remove the shampoo.                             4.  Use CHG as you would any other liquid soap.  You can apply chg directly to the skin and wash.  Gently with a scrungie or clean washcloth.  5.  Apply the CHG Soap to your body ONLY FROM THE NECK DOWN.   Do   not use on face/ open                           Wound or open sores. Avoid contact with eyes, ears mouth and   genitals (private parts).                       Wash face,  Genitals (private parts) with your normal soap.             6.  Wash thoroughly, paying special attention to the area where your    surgery  will be performed.  7.  Thoroughly rinse your body with warm water from the neck down.  8.  DO NOT shower/wash with your normal soap after using and rinsing off the CHG Soap.                9.  Pat yourself dry with a clean towel.            10.  Wear clean pajamas.            11.  Place clean sheets on your bed the night of your first shower and do not  sleep with pets. Day of Surgery : Do not apply any lotions/deodorants the morning of surgery.  Please wear clean clothes to the hospital/surgery center.  FAILURE TO FOLLOW THESE INSTRUCTIONS MAY RESULT IN THE CANCELLATION OF YOUR SURGERY   _How to Manage Your Diabetes Before and After Surgery  Why is it important to control my blood sugar before and after surgery? Improving blood sugar levels before and after surgery helps healing and can limit problems. A way of improving blood sugar control is eating a healthy diet by:  Eating less sugar and carbohydrates  Increasing activity/exercise  Talking with your doctor about reaching your blood sugar  goals High blood sugars (greater than 180 mg/dL) can raise your risk of infections and slow your recovery, so you will need to focus on controlling your diabetes during the weeks before surgery. Make sure that the doctor who takes care of your diabetes knows about your planned surgery including the date and location.  How do I manage my blood sugar before surgery? Check your blood sugar at least 4 times a day, starting 2 days before surgery, to make sure that the level is not too high or low. Check your blood sugar the morning of your surgery when you wake up and every 2 hours until you get to the Short Stay unit. If your blood sugar is less than 70 mg/dL, you will need to treat for low blood sugar: Do not take insulin . Treat a low blood sugar (less than 70 mg/dL) with  cup of clear juice (cranberry or apple), 4 glucose tablets, OR glucose gel. Recheck blood sugar in 15 minutes after treatment (to make sure it is greater than 70 mg/dL). If your blood sugar is not greater than 70 mg/dL on recheck, call [phone] for further instructions. Report your blood sugar to the short stay nurse when you get to Short Stay.  If you are admitted to the hospital after surgery: Your blood sugar will be checked by the staff and you will probably be given insulin  after surgery (instead of oral diabetes medicines) to make sure you have good blood sugar levels. The goal for blood sugar control after surgery is 80-180 mg/dL.   WHAT DO I DO ABOUT MY DIABETES MEDICATION?  Do not take oral diabetes medicines (pills) the morning of surgery.Hold Metformin  the morning of surgery.   THE MORNING OF SURGERY, take only half the dose of insulin   Patient Signature:  Date:   Nurse Signature:  Date:   Reviewed and Endorsed by [facility] Patient Education Committee, August 2015

## 2023-11-07 NOTE — Progress Notes (Addendum)
 COVID Vaccine received:  []  No [x]  Yes Date of any COVID positive Test in last 90 days: no PCP - Minna Amass NP Cardiologist - Dr. Alexandria Angel  Vascular surgery- Dr. Beverli Bucker  Chest x-ray - Chest CT 06/12/23 Epic EKG - 01/27/23 Epic  Stress Test -  ECHO - 03/27/23 Epic Cardiac Cath -   nBowel Prep - [x]  No  []   Yes ______  Pacemaker / ICD device [x]  No []  Yes   Spinal Cord Stimulator:[x]  No []  Yes       History of Sleep Apnea? [x]  No []  Yes   CPAP used?- [x]  No []  Yes    Does the patient monitor blood sugar?          [x]  No []  Yes  []  N/A  Patient has: []  NO Hx DM   []  Pre-DM                 []  DM1  [x]   DM2 Does patient have a [employer] or Dexacom? []  No [x]  Yes   Fasting Blood Sugar Ranges- 130-140 Checks Blood Sugar __10___ times a day  GLP1 agonist / usual dose - no GLP1 instructions:  SGLT-2 inhibitors / usual dose - no SGLT-2 instructions:   Blood Thinner / Instructions:no Aspirin Instructions:no  Comments:   Activity level: Patient is able to climb a flight of stairs without difficulty; [x]  No CP  [x]  No SOB,    Patient can perform ADLs without assistance.   Anesthesia review: HTN, DM, AAA, L BBB, dissecting aorta  Patient denies shortness of breath, fever, cough and chest pain at PAT appointment.  Patient verbalized understanding and agreement to the Pre-Surgical Instructions that were given to them at this PAT appointment. Patient was also educated of the need to review these PAT instructions again prior to his/her surgery.I reviewed the appropriate phone numbers to call if they have any and questions or concerns.

## 2023-11-09 ENCOUNTER — Encounter: Payer: Self-pay | Admitting: Urology

## 2023-11-09 ENCOUNTER — Ambulatory Visit: Admitting: Urology

## 2023-11-09 ENCOUNTER — Encounter (HOSPITAL_COMMUNITY): Payer: Self-pay

## 2023-11-09 ENCOUNTER — Encounter (HOSPITAL_COMMUNITY)
Admission: RE | Admit: 2023-11-09 | Discharge: 2023-11-09 | Disposition: A | Discharge status: Home / Self Care | Source: Ambulatory Visit | Attending: Urology | Admitting: Urology

## 2023-11-09 ENCOUNTER — Other Ambulatory Visit: Payer: Self-pay

## 2023-11-09 VITALS — BP 143/81 | HR 57 | Temp 98.0°F | Resp 16 | Ht 70.0 in | Wt 225.0 lb

## 2023-11-09 VITALS — BP 122/66 | HR 65 | Ht 70.0 in | Wt 225.0 lb

## 2023-11-09 DIAGNOSIS — Z87891 Personal history of nicotine dependence: Secondary | ICD-10-CM | POA: Insufficient documentation

## 2023-11-09 DIAGNOSIS — I2510 Atherosclerotic heart disease of native coronary artery without angina pectoris: Secondary | ICD-10-CM | POA: Insufficient documentation

## 2023-11-09 DIAGNOSIS — N433 Hydrocele, unspecified: Secondary | ICD-10-CM | POA: Insufficient documentation

## 2023-11-09 DIAGNOSIS — Z01812 Encounter for preprocedural laboratory examination: Secondary | ICD-10-CM | POA: Insufficient documentation

## 2023-11-09 DIAGNOSIS — Z794 Long term (current) use of insulin: Secondary | ICD-10-CM | POA: Insufficient documentation

## 2023-11-09 DIAGNOSIS — I7102 Dissection of abdominal aorta: Secondary | ICD-10-CM | POA: Insufficient documentation

## 2023-11-09 DIAGNOSIS — I110 Hypertensive heart disease with heart failure: Secondary | ICD-10-CM | POA: Insufficient documentation

## 2023-11-09 DIAGNOSIS — Z01818 Encounter for other preprocedural examination: Secondary | ICD-10-CM | POA: Diagnosis not present

## 2023-11-09 DIAGNOSIS — I447 Left bundle-branch block, unspecified: Secondary | ICD-10-CM | POA: Diagnosis not present

## 2023-11-09 DIAGNOSIS — I10 Essential (primary) hypertension: Secondary | ICD-10-CM

## 2023-11-09 DIAGNOSIS — E1165 Type 2 diabetes mellitus with hyperglycemia: Secondary | ICD-10-CM | POA: Diagnosis not present

## 2023-11-09 DIAGNOSIS — Z7984 Long term (current) use of oral hypoglycemic drugs: Secondary | ICD-10-CM | POA: Insufficient documentation

## 2023-11-09 DIAGNOSIS — I5032 Chronic diastolic (congestive) heart failure: Secondary | ICD-10-CM | POA: Insufficient documentation

## 2023-11-09 HISTORY — DX: Personal history of urinary calculi: Z87.442

## 2023-11-09 HISTORY — DX: Cardiac arrhythmia, unspecified: I49.9

## 2023-11-09 LAB — CBC
HCT: 42.8 % (ref 39.0–52.0)
Hemoglobin: 14.2 g/dL (ref 13.0–17.0)
MCH: 28.5 pg (ref 26.0–34.0)
MCHC: 33.2 g/dL (ref 30.0–36.0)
MCV: 85.8 fL (ref 80.0–100.0)
Platelets: 183 10*3/uL (ref 150–400)
RBC: 4.99 MIL/uL (ref 4.22–5.81)
RDW: 12.7 % (ref 11.5–15.5)
WBC: 9.2 10*3/uL (ref 4.0–10.5)
nRBC: 0 % (ref 0.0–0.2)

## 2023-11-09 LAB — BASIC METABOLIC PANEL WITH GFR
Anion gap: 9 (ref 5–15)
BUN: 26 mg/dL — ABNORMAL HIGH (ref 6–20)
CO2: 21 mmol/L — ABNORMAL LOW (ref 22–32)
Calcium: 9.3 mg/dL (ref 8.9–10.3)
Chloride: 107 mmol/L (ref 98–111)
Creatinine, Ser: 1.1 mg/dL (ref 0.61–1.24)
GFR, Estimated: >60 mL/min (ref >60)
Glucose, Bld: 222 mg/dL — ABNORMAL HIGH (ref 70–99)
Potassium: 4 mmol/L (ref 3.5–5.1)
Sodium: 137 mmol/L (ref 135–145)

## 2023-11-09 LAB — HEMOGLOBIN A1C
Hgb A1c MFr Bld: 6.8 % — ABNORMAL HIGH (ref 4.8–5.6)
Mean Plasma Glucose: 148.46 mg/dL

## 2023-11-09 NOTE — Progress Notes (Signed)
 Assessment: 1. Left hydrocele     Plan: I personally reviewed the scrotal ultrasound results from 10/02/2023 showing a left hydrocele Diagnosis and management of hydrocele discussed with the patient.  Options for management including observation and surgical management with hydrocelectomy discussed.  Hydrocelectomy procedure discussed with the patient.  I discussed potential risks including but not limited to infection, bleeding, injury to scrotal structures, recurrence of hydrocele, need for additional procedures, and anesthetic complications. He understands wishes to proceed with a left hydrocelectomy as described.  Procedure: The patient will be scheduled for left hydrocelectomy at [facility].  Surgical request is placed with the surgery schedulers and will be scheduled at the patient's/family request. Informed consent is given as documented below. Anesthesia: General  The patient does not have sleep apnea, history of MRSA, history of VRE, history of cardiac device requiring special anesthetic needs. Patient is stable and considered clear for surgical management in an outpatient ambulatory surgery setting as well as inpatient hospital setting.  Consent for Operation or Procedure: Provider Certification I hereby certify that the nature, purpose, benefits, usual and most frequent risks of, and alternatives to, the operation or procedure have been explained to the patient (or person authorized to sign for the patient) either by me as responsible physician or by the provider who is to perform the operation or procedure. Time spent such that the patient/family has had an opportunity to ask questions, and that those questions have been answered. The patient or the patient's representative has been advised that selected tasks may be performed by assistants to the primary health care provider(s). I believe that the patient (or person authorized to sign for the patient) understands what has been  explained, and has consented to the operation or procedure. No guarantees were implied or made.  Chief Complaint:  Chief Complaint  Patient presents with   Hydrocele    History of Present Illness:  Patrick [name] is a 58 y.o. male who is seen for further evaluation of left scrotal swelling.   He noted onset of left scrotal enlargement approximately 6 months ago with gradual increase in size.  No pain associated with this.  No history of scrotal infection or trauma. He does not have any significant lower urinary tract symptoms other than occasional frequency.  No dysuria or gross hematuria. IPSS = 8.  Scrotal ultrasound from 10/02/2023 showed a large left hydrocele measuring 10.8 x 7.6 x 9.0 cm with mobile internal debris.  He presents today for preoperative discussion for left hydrocelectomy.  Portions of the above documentation were copied from a prior visit for review purposes only.   Past Medical History:  Past Medical History:  Diagnosis Date   AAA (abdominal aortic aneurysm) (HCC)    Aortic dissection (HCC)    Diabetes mellitus (HCC)    Hyperlipidemia    Hypertension    Kidney stone    Nosebleed     Past Surgical History:  Past Surgical History:  Procedure Laterality Date   CYSTOSCOPY     HERNIA REPAIR     KIDNEY STONE SURGERY     LITHOTRIPSY      Allergies:  No Known Allergies  Family History:  Family History  Problem Relation Age of Onset   Hypertension Mother     Social History:  Social History   Tobacco Use   Smoking status: Former   Smokeless tobacco: Never  Substance Use Topics   Alcohol use: Not Currently    Comment: daily   Drug use: Yes  Types: Marijuana    ROS: Constitutional:  Negative for fever, chills, weight loss CV: Negative for chest pain, previous MI, hypertension Respiratory:  Negative for shortness of breath, wheezing, sleep apnea, frequent cough GI:  Negative for nausea, vomiting, bloody stool, GERD  Physical exam: BP  122/66   Pulse 65   Ht 5\' 10"  (1.778 m)   Wt 225 lb (102.1 kg)   BMI 32.28 kg/m  GENERAL APPEARANCE:  Well appearing, well developed, well nourished, NAD HEENT:  Atraumatic, normocephalic, oropharynx clear NECK:  Supple without lymphadenopathy or thyromegaly ABDOMEN:  Soft, non-tender, no masses EXTREMITIES:  Moves all extremities well, without clubbing, cyanosis, or edema NEUROLOGIC:  Alert and oriented x 3, normal gait, CN II-XII grossly intact MENTAL STATUS:  appropriate BACK:  Non-tender to palpation, No CVAT SKIN:  Warm, dry, and intact GU:  Left scrotal enlargement consistent with hydrocele   Results: None

## 2023-11-09 NOTE — H&P (View-Only) (Signed)
 Assessment: 1. Left hydrocele     Plan: I personally reviewed the scrotal ultrasound results from 10/02/2023 showing a left hydrocele Diagnosis and management of hydrocele discussed with the patient.  Options for management including observation and surgical management with hydrocelectomy discussed.  Hydrocelectomy procedure discussed with the patient.  I discussed potential risks including but not limited to infection, bleeding, injury to scrotal structures, recurrence of hydrocele, need for additional procedures, and anesthetic complications. He understands wishes to proceed with a left hydrocelectomy as described.  Procedure: The patient will be scheduled for left hydrocelectomy at [facility].  Surgical request is placed with the surgery schedulers and will be scheduled at the patient's/family request. Informed consent is given as documented below. Anesthesia: General  The patient does not have sleep apnea, history of MRSA, history of VRE, history of cardiac device requiring special anesthetic needs. Patient is stable and considered clear for surgical management in an outpatient ambulatory surgery setting as well as inpatient hospital setting.  Consent for Operation or Procedure: Provider Certification I hereby certify that the nature, purpose, benefits, usual and most frequent risks of, and alternatives to, the operation or procedure have been explained to the patient (or person authorized to sign for the patient) either by me as responsible physician or by the provider who is to perform the operation or procedure. Time spent such that the patient/family has had an opportunity to ask questions, and that those questions have been answered. The patient or the patient's representative has been advised that selected tasks may be performed by assistants to the primary health care provider(s). I believe that the patient (or person authorized to sign for the patient) understands what has been  explained, and has consented to the operation or procedure. No guarantees were implied or made.  Chief Complaint:  Chief Complaint  Patient presents with   Hydrocele    History of Present Illness:  Patrick [name] is a 58 y.o. male who is seen for further evaluation of left scrotal swelling.   He noted onset of left scrotal enlargement approximately 6 months ago with gradual increase in size.  No pain associated with this.  No history of scrotal infection or trauma. He does not have any significant lower urinary tract symptoms other than occasional frequency.  No dysuria or gross hematuria. IPSS = 8.  Scrotal ultrasound from 10/02/2023 showed a large left hydrocele measuring 10.8 x 7.6 x 9.0 cm with mobile internal debris.  He presents today for preoperative discussion for left hydrocelectomy.  Portions of the above documentation were copied from a prior visit for review purposes only.   Past Medical History:  Past Medical History:  Diagnosis Date   AAA (abdominal aortic aneurysm) (HCC)    Aortic dissection (HCC)    Diabetes mellitus (HCC)    Hyperlipidemia    Hypertension    Kidney stone    Nosebleed     Past Surgical History:  Past Surgical History:  Procedure Laterality Date   CYSTOSCOPY     HERNIA REPAIR     KIDNEY STONE SURGERY     LITHOTRIPSY      Allergies:  No Known Allergies  Family History:  Family History  Problem Relation Age of Onset   Hypertension Mother     Social History:  Social History   Tobacco Use   Smoking status: Former   Smokeless tobacco: Never  Substance Use Topics   Alcohol use: Not Currently    Comment: daily   Drug use: Yes  Types: Marijuana    ROS: Constitutional:  Negative for fever, chills, weight loss CV: Negative for chest pain, previous MI, hypertension Respiratory:  Negative for shortness of breath, wheezing, sleep apnea, frequent cough GI:  Negative for nausea, vomiting, bloody stool, GERD  Physical exam: BP  122/66   Pulse 65   Ht 5\' 10"  (1.778 m)   Wt 225 lb (102.1 kg)   BMI 32.28 kg/m  GENERAL APPEARANCE:  Well appearing, well developed, well nourished, NAD HEENT:  Atraumatic, normocephalic, oropharynx clear NECK:  Supple without lymphadenopathy or thyromegaly ABDOMEN:  Soft, non-tender, no masses EXTREMITIES:  Moves all extremities well, without clubbing, cyanosis, or edema NEUROLOGIC:  Alert and oriented x 3, normal gait, CN II-XII grossly intact MENTAL STATUS:  appropriate BACK:  Non-tender to palpation, No CVAT SKIN:  Warm, dry, and intact GU:  Left scrotal enlargement consistent with hydrocele   Results: None

## 2023-11-15 NOTE — Progress Notes (Signed)
 Case: [id number] Date/Time: 11/21/23 1415   Procedure: HYDROCELECTOMY (Left)   Anesthesia type: General   Diagnosis: Left hydrocele [N43.3]   Pre-op diagnosis: Left hydrocele   Location: [facility] PROCED[facility] / [facility]   Surgeons: Mellie Sprinkle., MD       DISCUSSION: Patrick [name] is a 58 yo male who presents to PAT prior to surgery above. PMH of former smoking, HTN, CAD (by CT), LBBB, chronic dissecting AAA, HFpEF, pulmonary nodules, IDDM (A1c 6.8)  Patient is followed by Vascular for hx of dissecting AAA which was an incidental finding when he was hospitalized for pancreatitis in March 2023. Last seen by Dr. Edgardo Goodwill on 05/09/2023. He was advised that repair would not be considered until aneurysm is 55mm. Advised f/u in 1 year for repeat imaging.   Seen by PCP on 06/26/23. BP and DM controlled.  Seen by Cardiology on 01/27/23 for management of HTN and eval for LBBB. Echo ordered which showed normal LVEF 55-60%, grade II diastolic dysfunction, dilation of aortic root (44mm). Advised f/u in 6 months.  VS: BP (!) 143/81   Pulse (!) 57   Temp 36.7 C (Oral)   Resp 16   Ht 5\' 10"  (1.778 m)   Wt 102.1 kg   SpO2 99%   BMI 32.28 kg/m   PROVIDERS: Everlina Hock, NP   LABS: Labs reviewed: Acceptable for surgery. (all labs ordered are listed, but only abnormal results are displayed)  Labs Reviewed  HEMOGLOBIN A1C - Abnormal; Notable for the following components:      Result Value   Hgb A1c MFr Bld 6.8 (*)    All other components within normal limits  BASIC METABOLIC PANEL WITH GFR - Abnormal; Notable for the following components:   CO2 21 (*)    Glucose, Bld 222 (*)    BUN 26 (*)    All other components within normal limits  CBC     IMAGES:  CT Chest 06/12/23:  IMPRESSION: 1. Stable area of poorly defined ground-glass with central cavitation in the subpleural superior segment of the left lower lobe. This exam constitutes 8 months of imaging stability. Recommend additional  CT every 2 years until 5 years of imaging stability is documented. 2. Additional small pulmonary nodules are unchanged. No new or enlarging pulmonary nodules. These can be reassessed on follow-up. 3. Coronary artery calcifications. Aortic Atherosclerosis (ICD10-I70.0). 4. Small hiatal hernia.    CTA Chest/abd/pelvis 10/31/22:  IMPRESSION: 1. Minimal change in the abdominal aortic aneurysm and dissection. Proximal abdominal aorta has minimally enlarged since March 2024. Configuration and extent of the abdominal aortic dissection is stable. Maximum dimension of the abdominal aortic aneurysm is 4.7 cm and stable. 2. Indeterminate ground-glass opacity in the posterior left lung. This area measures up to 1.5 cm. Multiple additional small nodules scattered throughout the lungs. Non-contrast chest CT at 3-6 months is recommended. If the nodules are stable at time of repeat CT, then future CT at 18-24 months (from today's scan) is considered optional for low-risk patients, but is recommended for high-risk patients. This recommendation follows the consensus statement: Guidelines for Management of Incidental Pulmonary Nodules Detected on CT Images: From the Fleischner Society 2017; Radiology 2017; 284:228-243. 3. **An incidental finding of potential clinical significance has been found. Slowly enlarging low-density structure in the central mesentery near the proximal jejunum and ligament of Treitz. In retrospect, this structure was present in 2018 and it has slowly enlarged. This structure measures up to 2.5 cm. The minimal growth since  2018 is reassuring suggesting this could represent a benign etiology such as a reactive lymph node or atypical mesenteric or duplication cyst. However, an indolent neoplastic process cannot be excluded. Recommend continued surveillance of this area versus further characterization with a PET-CT.** 4. Cholelithiasis. 5. Slowly enlarging low-density structure  in the spleen. This is likely a benign etiology. 6. Celiac trunk is mildly dilated with a slightly beaded configuration. Findings could represent underlying fibromuscular dysplasia.  EKG:   CV: US  AAA 05/09/23:  Summary: Abdominal Aorta: There is evidence of abnormal dilatation of the distal Abdominal aorta. Mid Aorta dissection with mural thrombus in the false lumen. The proximal aorta is not well visualized due to bowel gas. The largest aortic diameter remains essentially unchanged compared to prior exam. Previous diameter measurement was 4.7 cm obtained on 10/30/21.  Echo 03/27/23:  IMPRESSIONS    1. Left ventricular ejection fraction, by estimation, is 55 to 60%. The left ventricle has normal function. Left ventricular endocardial border not optimally defined to evaluate regional wall motion. There is moderate concentric left ventricular hypertrophy. Left ventricular diastolic parameters are consistent with Grade II diastolic dysfunction (pseudonormalization). Elevated left atrial pressure.  2. Right ventricular systolic function is normal. The right ventricular size is normal.  3. Left atrial size was severely dilated.  4. The mitral valve is normal in structure. No evidence of mitral valve regurgitation. No evidence of mitral stenosis.  5. The aortic valve is tricuspid. Aortic valve regurgitation is not visualized. No aortic stenosis is present.  6. Aortic DTA is NWV. 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  7. The inferior vena cava is normal in size with greater than 50% respiratory variability, suggesting right atrial pressure of 3 mmHg.  Comparison(s): Echocardiogram done 07/06/18 showed an EF of >50%.  Past Medical History:  Diagnosis Date   AAA (abdominal aortic aneurysm) (HCC)    Aortic dissection (HCC)    Diabetes mellitus (HCC)    Dysrhythmia    History of kidney stones    Hyperlipidemia     Hypertension    Kidney stone    Nosebleed     Past Surgical History:  Procedure Laterality Date   CYSTOSCOPY     HERNIA REPAIR     KIDNEY STONE SURGERY     LITHOTRIPSY      MEDICATIONS:  amLODipine  (NORVASC ) 5 MG tablet   atorvastatin  (LIPITOR) 20 MG tablet   b complex vitamins capsule   carvedilol  (COREG ) 25 MG tablet   cloNIDine  (CATAPRES ) 0.1 MG tablet   Continuous Glucose Sensor (FREESTYLE LIBRE 3 SENSOR) MISC   fenofibrate  160 MG tablet   folic acid  (FOLVITE ) 1 MG tablet   hydrALAZINE  (APRESOLINE ) 100 MG tablet   insulin  glargine-yfgn (SEMGLEE ) 100 UNIT/ML Pen   Insulin  Pen Needle (PEN NEEDLES) 32G X 4 MM MISC   losartan  (COZAAR ) 100 MG tablet   metFORMIN  (GLUCOPHAGE -XR) 500 MG 24 hr tablet   Multiple Vitamin (MULTIVITAMIN WITH MINERALS) TABS tablet   Omega-3 Fatty Acids (FISH OIL TRIPLE STRENGTH) 1400 MG CAPS   Psyllium (METAMUCIL PO)   sildenafil  (VIAGRA ) 50 MG tablet   spironolactone  (ALDACTONE ) 25 MG tablet   No current facility-administered medications for this encounter.   Antoinette Kirschner [facility] Short Stay/Anesthesiology [facility] Phone [phone] 11/15/2023 2:04 PM

## 2023-11-15 NOTE — Anesthesia Preprocedure Evaluation (Addendum)
 Anesthesia Evaluation  Patient identified by MRN, date of birth, ID band Patient awake    Reviewed: Allergy & Precautions, NPO status , Patient's Chart, lab work & pertinent test results  History of Anesthesia Complications Negative for: history of anesthetic complications  Airway Mallampati: II  TM Distance: >3 FB Neck ROM: Full    Dental no notable dental hx. (+) Teeth Intact, Dental Advisory Given   Pulmonary former smoker Pulm Nodules   Pulmonary exam normal breath sounds clear to auscultation       Cardiovascular hypertension, Pt. on medications (-) angina + Peripheral Vascular Disease (Chronic dissecting AAA) and +CHF (HFpEF)  (-) Past MI Normal cardiovascular exam+ dysrhythmias (LBBB)  Rhythm:Regular Rate:Normal  03/2023 TTE 1. Left ventricular ejection fraction, by estimation, is 55 to 60%. The  left ventricle has normal function. Left ventricular endocardial border  not optimally defined to evaluate regional wall motion. There is moderate  concentric left ventricular  hypertrophy. Left ventricular diastolic parameters are consistent with  Grade II diastolic dysfunction (pseudonormalization). Elevated left atrial  pressure.   2. Right ventricular systolic function is normal. The right ventricular  size is normal.   3. Left atrial size was severely dilated.   4. The mitral valve is normal in structure. No evidence of mitral valve  regurgitation. No evidence of mitral stenosis.   5. The aortic valve is tricuspid. Aortic valve regurgitation is not  visualized. No aortic stenosis is present.   6. Aortic DTA is NWV. There is moderate dilatation of the aortic root and  of the ascending aorta, measuring 44 mm.   7. The inferior vena cava is normal in size with greater than 50%  respiratory variability, suggesting right atrial pressure of 3 mmHg.      Neuro/Psych  Headaches    GI/Hepatic negative GI ROS, Neg liver  ROS,,,  Endo/Other  diabetes, Well Controlled, Type 1, Insulin  Dependent    Renal/GU Renal diseaseLab Results      Component                Value               Date                          K                        4.0                 11/09/2023                 CREATININE               1.10                11/09/2023                     Musculoskeletal   Abdominal   Peds  Hematology Lab Results      Component                Value               Date                      WBC                      9.2  11/09/2023                HGB                      14.2                11/09/2023                HCT                      42.8                11/09/2023                MCV                      85.8                11/09/2023                PLT                      183                 11/09/2023              Anesthesia Other Findings   Reproductive/Obstetrics                             Anesthesia Physical Anesthesia Plan  ASA: 3  Anesthesia Plan: General   Post-op Pain Management: Celebrex PO (pre-op)* and Ofirmev  IV (intra-op)*   Induction: Intravenous  PONV Risk Score and Plan: 3 and Treatment may vary due to age or medical condition, Midazolam, Ondansetron and Scopolamine patch - Pre-op  Airway Management Planned: LMA  Additional Equipment: None  Intra-op Plan:   Post-operative Plan: Extubation in OR  Informed Consent: I have reviewed the patients History and Physical, chart, labs and discussed the procedure including the risks, benefits and alternatives for the proposed anesthesia with the patient or authorized representative who has indicated his/her understanding and acceptance.     Dental advisory given  Plan Discussed with: CRNA and Surgeon  Anesthesia Plan Comments: (See PAT note on 5/1)        Anesthesia Quick Evaluation

## 2023-11-21 ENCOUNTER — Ambulatory Visit (HOSPITAL_COMMUNITY): Payer: Self-pay | Admitting: Physician Assistant

## 2023-11-21 ENCOUNTER — Other Ambulatory Visit: Payer: Self-pay

## 2023-11-21 ENCOUNTER — Ambulatory Visit (HOSPITAL_COMMUNITY): Payer: Self-pay | Admitting: Anesthesiology

## 2023-11-21 ENCOUNTER — Encounter (HOSPITAL_COMMUNITY)
Admission: RE | Disposition: A | Discharge status: Home / Self Care | Payer: Self-pay | Source: Ambulatory Visit | Attending: Urology

## 2023-11-21 ENCOUNTER — Ambulatory Visit (HOSPITAL_COMMUNITY)
Admission: RE | Admit: 2023-11-21 | Discharge: 2023-11-21 | Disposition: A | Discharge status: Home / Self Care | Source: Ambulatory Visit | Attending: Urology | Admitting: Urology

## 2023-11-21 ENCOUNTER — Encounter (HOSPITAL_COMMUNITY): Payer: Self-pay | Admitting: Urology

## 2023-11-21 DIAGNOSIS — Z87891 Personal history of nicotine dependence: Secondary | ICD-10-CM | POA: Insufficient documentation

## 2023-11-21 DIAGNOSIS — E109 Type 1 diabetes mellitus without complications: Secondary | ICD-10-CM | POA: Insufficient documentation

## 2023-11-21 DIAGNOSIS — I509 Heart failure, unspecified: Secondary | ICD-10-CM | POA: Insufficient documentation

## 2023-11-21 DIAGNOSIS — Z794 Long term (current) use of insulin: Secondary | ICD-10-CM | POA: Insufficient documentation

## 2023-11-21 DIAGNOSIS — I110 Hypertensive heart disease with heart failure: Secondary | ICD-10-CM | POA: Diagnosis not present

## 2023-11-21 DIAGNOSIS — N433 Hydrocele, unspecified: Secondary | ICD-10-CM | POA: Insufficient documentation

## 2023-11-21 DIAGNOSIS — E119 Type 2 diabetes mellitus without complications: Secondary | ICD-10-CM | POA: Diagnosis not present

## 2023-11-21 DIAGNOSIS — I10 Essential (primary) hypertension: Secondary | ICD-10-CM | POA: Diagnosis not present

## 2023-11-21 HISTORY — PX: HYDROCELE EXCISION: SHX482

## 2023-11-21 LAB — GLUCOSE, CAPILLARY
Glucose-Capillary: 129 mg/dL — ABNORMAL HIGH (ref 70–99)
Glucose-Capillary: 150 mg/dL — ABNORMAL HIGH (ref 70–99)

## 2023-11-21 SURGERY — HYDROCELECTOMY
Anesthesia: General | Laterality: Left

## 2023-11-21 MED ORDER — CELECOXIB 200 MG PO CAPS
200.0000 mg | ORAL_CAPSULE | Freq: Once | ORAL | Status: AC
  Administered 2023-11-21: 200 mg via ORAL
  Filled 2023-11-21: qty 1

## 2023-11-21 MED ORDER — HYDROMORPHONE HCL 1 MG/ML IJ SOLN
INTRAMUSCULAR | Status: AC
  Filled 2023-11-21: qty 1

## 2023-11-21 MED ORDER — LACTATED RINGERS IV SOLN: INTRAVENOUS | Status: DC

## 2023-11-21 MED ORDER — ONDANSETRON HCL 4 MG/2ML IJ SOLN
INTRAMUSCULAR | Status: DC | PRN
  Administered 2023-11-21: 4 mg via INTRAVENOUS

## 2023-11-21 MED ORDER — BUPIVACAINE HCL (PF) 0.25 % IJ SOLN
INTRAMUSCULAR | Status: AC
  Filled 2023-11-21: qty 30

## 2023-11-21 MED ORDER — SCOPOLAMINE 1 MG/3DAYS TD PT72
MEDICATED_PATCH | TRANSDERMAL | Status: AC
  Filled 2023-11-21: qty 1

## 2023-11-21 MED ORDER — EPHEDRINE SULFATE-NACL 50-0.9 MG/10ML-% IV SOSY
PREFILLED_SYRINGE | INTRAVENOUS | Status: DC | PRN
  Administered 2023-11-21: 10 mg via INTRAVENOUS
  Administered 2023-11-21: 5 mg via INTRAVENOUS
  Administered 2023-11-21: 10 mg via INTRAVENOUS

## 2023-11-21 MED ORDER — MIDAZOLAM HCL 2 MG/2ML IJ SOLN
INTRAMUSCULAR | Status: AC
  Filled 2023-11-21: qty 2

## 2023-11-21 MED ORDER — FENTANYL CITRATE (PF) 100 MCG/2ML IJ SOLN
INTRAMUSCULAR | Status: DC | PRN
  Administered 2023-11-21 (×3): 25 ug via INTRAVENOUS

## 2023-11-21 MED ORDER — ACETAMINOPHEN 10 MG/ML IV SOLN: 1000.0000 mg | Freq: Once | INTRAVENOUS | Status: DC | PRN

## 2023-11-21 MED ORDER — PHENYLEPHRINE 80 MCG/ML (10ML) SYRINGE FOR IV PUSH (FOR BLOOD PRESSURE SUPPORT)
PREFILLED_SYRINGE | INTRAVENOUS | Status: DC | PRN
  Administered 2023-11-21 (×3): 80 ug via INTRAVENOUS

## 2023-11-21 MED ORDER — FENTANYL CITRATE (PF) 100 MCG/2ML IJ SOLN
INTRAMUSCULAR | Status: AC
  Filled 2023-11-21: qty 2

## 2023-11-21 MED ORDER — HYDROCODONE-ACETAMINOPHEN 5-325 MG PO TABS
1.0000 | ORAL_TABLET | Freq: Four times a day (QID) | ORAL | 0 refills | Status: AC | PRN
Start: 2023-11-21 — End: ?

## 2023-11-21 MED ORDER — PROPOFOL 10 MG/ML IV BOLUS
INTRAVENOUS | Status: DC | PRN
  Administered 2023-11-21: 200 mg via INTRAVENOUS

## 2023-11-21 MED ORDER — ACETAMINOPHEN 10 MG/ML IV SOLN
INTRAVENOUS | Status: AC
  Filled 2023-11-21: qty 100

## 2023-11-21 MED ORDER — ONDANSETRON HCL 4 MG/2ML IJ SOLN: 4.0000 mg | Freq: Once | INTRAMUSCULAR | Status: DC | PRN

## 2023-11-21 MED ORDER — SCOPOLAMINE 1 MG/3DAYS TD PT72
1.0000 | MEDICATED_PATCH | Freq: Once | TRANSDERMAL | Status: DC
  Administered 2023-11-21: 1.5 mg via TRANSDERMAL
  Filled 2023-11-21: qty 1

## 2023-11-21 MED ORDER — BUPIVACAINE HCL (PF) 0.25 % IJ SOLN
INTRAMUSCULAR | Status: DC | PRN
  Administered 2023-11-21: 10 mL

## 2023-11-21 MED ORDER — ORAL CARE MOUTH RINSE: 15.0000 mL | Freq: Once | OROMUCOSAL | Status: AC

## 2023-11-21 MED ORDER — CEPHALEXIN 500 MG PO CAPS: 500.0000 mg | ORAL_CAPSULE | Freq: Three times a day (TID) | ORAL | 0 refills | Status: AC

## 2023-11-21 MED ORDER — CEFAZOLIN SODIUM-DEXTROSE 2-4 GM/100ML-% IV SOLN
2.0000 g | INTRAVENOUS | Status: AC
Start: 2023-11-21 — End: 2023-11-21
  Administered 2023-11-21: 2 g via INTRAVENOUS
  Filled 2023-11-21: qty 100

## 2023-11-21 MED ORDER — MIDAZOLAM HCL 2 MG/2ML IJ SOLN
INTRAMUSCULAR | Status: DC | PRN
  Administered 2023-11-21: 2 mg via INTRAVENOUS

## 2023-11-21 MED ORDER — LIDOCAINE HCL (CARDIAC) PF 100 MG/5ML IV SOSY
PREFILLED_SYRINGE | INTRAVENOUS | Status: DC | PRN
  Administered 2023-11-21: 100 mg via INTRAVENOUS

## 2023-11-21 MED ORDER — LIDOCAINE HCL (PF) 2 % IJ SOLN
INTRAMUSCULAR | Status: AC
  Filled 2023-11-21: qty 5

## 2023-11-21 MED ORDER — EPHEDRINE 5 MG/ML INJ
INTRAVENOUS | Status: AC
  Filled 2023-11-21: qty 5

## 2023-11-21 MED ORDER — OXYCODONE HCL 5 MG/5ML PO SOLN: 5.0000 mg | Freq: Once | ORAL | Status: DC | PRN

## 2023-11-21 MED ORDER — HYDROMORPHONE HCL 1 MG/ML IJ SOLN
0.2500 mg | INTRAMUSCULAR | Status: DC | PRN
  Administered 2023-11-21: 0.5 mg via INTRAVENOUS

## 2023-11-21 MED ORDER — DEXAMETHASONE SODIUM PHOSPHATE 10 MG/ML IJ SOLN
INTRAMUSCULAR | Status: DC | PRN
  Administered 2023-11-21: 10 mg via INTRAVENOUS

## 2023-11-21 MED ORDER — ACETAMINOPHEN 10 MG/ML IV SOLN
INTRAVENOUS | Status: DC | PRN
  Administered 2023-11-21: 1000 mg via INTRAVENOUS

## 2023-11-21 MED ORDER — DEXAMETHASONE SODIUM PHOSPHATE 10 MG/ML IJ SOLN
INTRAMUSCULAR | Status: AC
  Filled 2023-11-21: qty 1

## 2023-11-21 MED ORDER — CHLORHEXIDINE GLUCONATE 0.12 % MT SOLN
15.0000 mL | Freq: Once | OROMUCOSAL | Status: AC
  Administered 2023-11-21: 15 mL via OROMUCOSAL

## 2023-11-21 MED ORDER — PROPOFOL 10 MG/ML IV BOLUS
INTRAVENOUS | Status: AC
  Filled 2023-11-21: qty 20

## 2023-11-21 MED ORDER — OXYCODONE HCL 5 MG PO TABS: 5.0000 mg | ORAL_TABLET | Freq: Once | ORAL | Status: DC | PRN

## 2023-11-21 MED ORDER — ONDANSETRON HCL 4 MG/2ML IJ SOLN
INTRAMUSCULAR | Status: AC
  Filled 2023-11-21: qty 2

## 2023-11-21 SURGICAL SUPPLY — 29 items
BAG COUNTER SPONGE SURGICOUNT (BAG) IMPLANT
BNDG GAUZE DERMACEA FLUFF 4 (GAUZE/BANDAGES/DRESSINGS) ×1 IMPLANT
BRIEF MESH DISP LRG (UNDERPADS AND DIAPERS) IMPLANT
COVER SURGICAL LIGHT HANDLE (MISCELLANEOUS) ×1 IMPLANT
DERMABOND ADVANCED .7 DNX12 (GAUZE/BANDAGES/DRESSINGS) IMPLANT
DRAIN PENROSE 0.25X18 (DRAIN) IMPLANT
DRAPE LAPAROTOMY T 98X78 PEDS (DRAPES) ×1 IMPLANT
ELECT NDL TIP 2.8 STRL (NEEDLE) ×1 IMPLANT
ELECT NEEDLE TIP 2.8 STRL (NEEDLE) ×1 IMPLANT
ELECT REM PT RETURN 15FT ADLT (MISCELLANEOUS) ×1 IMPLANT
GLOVE SURG LX STRL 7.5 STRW (GLOVE) ×1 IMPLANT
GOWN STRL REUS W/ TWL XL LVL3 (GOWN DISPOSABLE) ×1 IMPLANT
KIT BASIN OR (CUSTOM PROCEDURE TRAY) ×1 IMPLANT
KIT TURNOVER KIT A (KITS) IMPLANT
NDL HYPO 22X1.5 SAFETY MO (MISCELLANEOUS) IMPLANT
NEEDLE HYPO 22X1.5 SAFETY MO (MISCELLANEOUS) IMPLANT
NS IRRIG 1000ML POUR BTL (IV SOLUTION) ×1 IMPLANT
PACK GENERAL/GYN (CUSTOM PROCEDURE TRAY) ×1 IMPLANT
SPIKE FLUID TRANSFER (MISCELLANEOUS) ×1 IMPLANT
SUPPORTER AHLETIC TETRA LG (SOFTGOODS) ×1 IMPLANT
SUT CHROMIC 3 0 SH 27 (SUTURE) ×2 IMPLANT
SUT MNCRL AB 4-0 PS2 18 (SUTURE) IMPLANT
SUT PROLENE 4-0 RB1 .5 CRCL 36 (SUTURE) IMPLANT
SUT VIC AB 2-0 UR5 27 (SUTURE) IMPLANT
SUT VIC AB 3-0 SH 27XBRD (SUTURE) IMPLANT
SUT VICRYL 0 TIES 12 18 (SUTURE) IMPLANT
SYR CONTROL 10ML LL (SYRINGE) IMPLANT
TOWEL OR 17X26 10 PK STRL BLUE (TOWEL DISPOSABLE) ×2 IMPLANT
WATER STERILE IRR 1000ML POUR (IV SOLUTION) ×1 IMPLANT

## 2023-11-21 NOTE — Op Note (Signed)
 OPERATIVE NOTE   Patient Name: Patrick [name]  MRN: [id number]   Date of Procedure: 11/21/23    Preoperative diagnosis:  Left hydrocele  Postoperative diagnosis:  Left hydrocele  Procedure:  Left hydrocelectomy  Attending: Mellie Sprinkle, MD  Anesthesia: General With local  Estimated blood loss: 10 mL  Fluids: Per anesthesia record  Drains: 1/4 inch Penrose drain in left scrotum  Specimens: Left hydrocele sac  Antibiotics: Ancef 2 g IV  Findings: Large left hydrocele containing 380 mL of straw-colored fluid; normal left testicle and epididymis  Indications:  58 year old male with symptomatic left hydrocele presents for surgical management with a left hydrocelectomy.  Scrotal ultrasound confirmed a large left hydrocele and normal testes bilaterally.  Treatment options were discussed with the patient.  He presents now for a left hydrocelectomy.  Risk and benefits of the procedure were discussed in detail.  He understands and wishes to proceed as described.  Description of Procedure:  The patient received IV Ancef preoperatively.  He was taken to the operating room suite and properly identified.  After successful induction of a general anesthetic he was placed in the supine position.  The patient scrotal area was prepped and draped in sterile fashion.  A preoperative timeout was performed.  Examination demonstrated enlargement of the left hemiscrotum consistent with the known hydrocele.  A midline incision was made along the median raphae.  The incision was carried down through the dartos fascia with the cautery.  The hydrocele was delivered into the wound.  The hydrocele sac was opened and 380 mL of straw-colored fluid was evacuated.  The excess hydrocele sac was excised.  Hemostasis was obtained with the cautery.  Inspection of the left testicle and epididymis was unremarkable.  There was no evidence of any connection to the peritoneal cavity.  The edges of the hydrocele sac  were then sutured behind the left testicle and cord using a running 3-0 Vicryl suture.  Care was taken to avoid any undue tension on the cord.  Good hemostasis was noted.  A 1/4 inch Penrose drain was placed into the dependent portion of the left hemiscrotum and secured with a chromic suture.  Hemostasis was confirmed within the left scrotum.  The testicle was placed back into its normal position.  The dartos layer was closed with a running 3-0 Vicryl suture.  10 mL of 0.25% plain Marcaine was injected into the wound.  The skin was then approximated using a running 3-0 chromic suture.  Dermabond was applied.  Sterile fluffs and a scrotal support were placed.  The patient was then extubated and taken to the post anesthesia care in stable condition.  All counts were correct at the end of the procedure.  Complications: None  Condition: Stable, extubated, transferred to PACU  Plan:  Discharge to home Local care to scrotum Return to office for drain removal in 2 days.

## 2023-11-21 NOTE — Anesthesia Postprocedure Evaluation (Signed)
 Anesthesia Post Note  Patient: Patrick [name]  Procedure(s) Performed: HYDROCELECTOMY (Left)     Patient location during evaluation: PACU Anesthesia Type: General Level of consciousness: awake and alert Pain management: pain level controlled Vital Signs Assessment: post-procedure vital signs reviewed and stable Respiratory status: spontaneous breathing, nonlabored ventilation, respiratory function stable and patient connected to nasal cannula oxygen Cardiovascular status: blood pressure returned to baseline and stable Postop Assessment: no apparent nausea or vomiting Anesthetic complications: no  No notable events documented.  Last Vitals:  Vitals:   11/21/23 1530 11/21/23 1545  BP: 134/87 133/88  Pulse: (!) 58 (!) 56  Resp: 14 16  Temp:    SpO2: 90% 92%    Last Pain:  Vitals:   11/21/23 1545  TempSrc:   PainSc: 0-No pain                 Rosalita Combe

## 2023-11-21 NOTE — Interval H&P Note (Signed)
 History and Physical Interval Note:  11/21/2023 12:50 PM  Patrick [name]  has presented today for surgery, with the diagnosis of Left hydrocele.  The various methods of treatment have been discussed with the patient and family. After consideration of risks, benefits and other options for treatment, the patient has consented to  Procedure(s): HYDROCELECTOMY (Left) as a surgical intervention.  The patient's history has been reviewed, patient examined, no change in status, stable for surgery.  I have reviewed the patient's chart and labs.  Questions were answered to the patient's satisfaction.     Oda Bence

## 2023-11-21 NOTE — Anesthesia Procedure Notes (Signed)
 Procedure Name: LMA Insertion Date/Time: 11/21/2023 1:07 PM  Performed by: Vella Gey, CRNAPatient Re-evaluated:Patient Re-evaluated prior to induction Oxygen Delivery Method: Circle system utilized Preoxygenation: Pre-oxygenation with 100% oxygen Induction Type: IV induction LMA: LMA inserted LMA Size: 4.0 Number of attempts: 1 Placement Confirmation: positive ETCO2 and breath sounds checked- equal and bilateral Tube secured with: Tape Dental Injury: Teeth and Oropharynx as per pre-operative assessment

## 2023-11-21 NOTE — Transfer of Care (Signed)
 Immediate Anesthesia Transfer of Care Note  Patient: Patrick [name]  Procedure(s) Performed: HYDROCELECTOMY (Left)  Patient Location: PACU  Anesthesia Type:General  Level of Consciousness: drowsy  Airway & Oxygen Therapy: Patient Spontanous Breathing and Patient connected to face mask oxygen  Post-op Assessment: Report given to RN and Post -op Vital signs reviewed and stable  Post vital signs: Reviewed and stable  Last Vitals:  Vitals Value Taken Time  BP 143/85 11/21/23 1425  Temp    Pulse 56 11/21/23 1426  Resp 15 11/21/23 1426  SpO2 100 % 11/21/23 1426  Vitals shown include unfiled device data.  Last Pain:  Vitals:   11/21/23 1152  TempSrc: Oral         Complications: No notable events documented.

## 2023-11-22 ENCOUNTER — Encounter (HOSPITAL_COMMUNITY): Payer: Self-pay | Admitting: Urology

## 2023-11-22 LAB — SURGICAL PATHOLOGY

## 2023-11-22 NOTE — Progress Notes (Unsigned)
 Assessment: No diagnosis found.   Plan: I personally reviewed the scrotal ultrasound results from 10/02/2023 showing a left hydrocele Diagnosis and management of hydrocele discussed with the patient.  Options for management including observation and surgical management with hydrocelectomy discussed.  Hydrocelectomy procedure discussed with the patient.  I discussed potential risks including but not limited to infection, bleeding, injury to scrotal structures, recurrence of hydrocele, need for additional procedures, and anesthetic complications. He understands wishes to proceed with a left hydrocelectomy as described.  Procedure: The patient will be scheduled for left hydrocelectomy at [facility].  Surgical request is placed with the surgery schedulers and will be scheduled at the patient's/family request. Informed consent is given as documented below. Anesthesia: General  The patient does not have sleep apnea, history of MRSA, history of VRE, history of cardiac device requiring special anesthetic needs. Patient is stable and considered clear for surgical management in an outpatient ambulatory surgery setting as well as inpatient hospital setting.  Consent for Operation or Procedure: Provider Certification I hereby certify that the nature, purpose, benefits, usual and most frequent risks of, and alternatives to, the operation or procedure have been explained to the patient (or person authorized to sign for the patient) either by me as responsible physician or by the provider who is to perform the operation or procedure. Time spent such that the patient/family has had an opportunity to ask questions, and that those questions have been answered. The patient or the patient's representative has been advised that selected tasks may be performed by assistants to the primary health care provider(s). I believe that the patient (or person authorized to sign for the patient) understands what has been  explained, and has consented to the operation or procedure. No guarantees were implied or made.  Chief Complaint:  No chief complaint on file.   History of Present Illness:  Patrick [name] is a 58 y.o. male who is seen for further evaluation of left hydrocele. He noted onset of left scrotal enlargement approximately 6 months ago with gradual increase in size.  No pain associated with this.  No history of scrotal infection or trauma. He does not have any significant lower urinary tract symptoms other than occasional frequency.  No dysuria or gross hematuria. IPSS = 8.  Scrotal ultrasound from 10/02/2023 showed a large left hydrocele measuring 10.8 x 7.6 x 9.0 cm with mobile internal debris.  He is s/p left hydrocelectomy on 11/21/23.  Path consistent with hydrocele sac. He presents today for drain removal.   Portions of the above documentation were copied from a prior visit for review purposes only.   Past Medical History:  Past Medical History:  Diagnosis Date   AAA (abdominal aortic aneurysm) (HCC)    Aortic dissection (HCC)    Diabetes mellitus (HCC)    Dysrhythmia    History of kidney stones    Hyperlipidemia    Hypertension    Kidney stone    Nosebleed     Past Surgical History:  Past Surgical History:  Procedure Laterality Date   CYSTOSCOPY     HERNIA REPAIR     HYDROCELE EXCISION Left 11/21/2023   Procedure: HYDROCELECTOMY;  Surgeon: Mellie Sprinkle., MD;  Location: [facility];  Service: Urology;  Laterality: Left;   KIDNEY STONE SURGERY     LITHOTRIPSY      Allergies:  No Known Allergies  Family History:  Family History  Problem Relation Age of Onset   Hypertension Mother     Social  History:  Social History   Tobacco Use   Smoking status: Former   Smokeless tobacco: Never  Advertising account planner   Vaping status: Never Used  Substance Use Topics   Alcohol use: Not Currently   Drug use: Yes    Types: Marijuana    ROS: Constitutional:  Negative for fever,  chills, weight loss CV: Negative for chest pain, previous MI, hypertension Respiratory:  Negative for shortness of breath, wheezing, sleep apnea, frequent cough GI:  Negative for nausea, vomiting, bloody stool, GERD  Physical exam: There were no vitals taken for this visit. GU:  Results: None

## 2023-11-23 ENCOUNTER — Encounter: Payer: Self-pay | Admitting: Urology

## 2023-11-23 ENCOUNTER — Ambulatory Visit (INDEPENDENT_AMBULATORY_CARE_PROVIDER_SITE_OTHER): Admitting: Urology

## 2023-11-23 VITALS — BP 126/73 | HR 56 | Ht 70.0 in | Wt 225.0 lb

## 2023-11-23 DIAGNOSIS — N433 Hydrocele, unspecified: Secondary | ICD-10-CM

## 2023-11-24 ENCOUNTER — Other Ambulatory Visit: Payer: Self-pay | Admitting: Family Medicine

## 2023-11-24 DIAGNOSIS — I10 Essential (primary) hypertension: Secondary | ICD-10-CM

## 2023-12-08 ENCOUNTER — Other Ambulatory Visit: Payer: Self-pay | Admitting: Family Medicine

## 2023-12-08 DIAGNOSIS — I10 Essential (primary) hypertension: Secondary | ICD-10-CM

## 2023-12-16 ENCOUNTER — Other Ambulatory Visit: Payer: Self-pay | Admitting: Family Medicine

## 2023-12-16 DIAGNOSIS — I10 Essential (primary) hypertension: Secondary | ICD-10-CM

## 2023-12-29 ENCOUNTER — Ambulatory Visit: Admitting: Urology

## 2023-12-29 ENCOUNTER — Encounter: Payer: Self-pay | Admitting: Urology

## 2023-12-29 VITALS — BP 121/69 | HR 67 | Ht 70.0 in | Wt 225.0 lb

## 2023-12-29 DIAGNOSIS — Z87438 Personal history of other diseases of male genital organs: Secondary | ICD-10-CM

## 2023-12-29 DIAGNOSIS — R399 Unspecified symptoms and signs involving the genitourinary system: Secondary | ICD-10-CM

## 2023-12-29 DIAGNOSIS — N433 Hydrocele, unspecified: Secondary | ICD-10-CM

## 2023-12-29 LAB — MICROSCOPIC EXAMINATION

## 2023-12-29 LAB — URINALYSIS, ROUTINE W REFLEX MICROSCOPIC
Bilirubin, UA: NEGATIVE
Glucose, UA: NEGATIVE
Leukocytes,UA: NEGATIVE
Nitrite, UA: NEGATIVE
RBC, UA: NEGATIVE
Specific Gravity, UA: >1.03 — ABNORMAL HIGH (ref 1.005–1.030)
Urobilinogen, Ur: 0.2 mg/dL (ref 0.2–1.0)
pH, UA: 5 (ref 5.0–7.5)

## 2023-12-29 NOTE — Progress Notes (Signed)
   Assessment: 1. Left hydrocele; s/p left hydrocelectomy 11/21/23   2. Lower urinary tract symptoms     Plan: May resume normal activity. Return to office in 3 months for DRE and PSA  Chief Complaint:  Chief Complaint  Patient presents with   Hydrocele    History of Present Illness:  Patrick [name] is a 58 y.o. male who is seen for further evaluation of left hydrocele. He noted onset of left scrotal enlargement approximately 6 months ago with gradual increase in size.  No pain associated with this.  No history of scrotal infection or trauma. He does not have any significant lower urinary tract symptoms other than occasional frequency.  No dysuria or gross hematuria. IPSS = 8.  Scrotal ultrasound from 10/02/2023 showed a large left hydrocele measuring 10.8 x 7.6 x 9.0 cm with mobile internal debris.  He is s/p left hydrocelectomy on 11/21/23.  Path consistent with hydrocele sac.  He returns today for follow-up.  He is doing very well.  He is not having any significant scrotal swelling or pain. He continues to have some lower urinary tract symptoms with frequency, urgency, and nocturia x 3.  No dysuria or gross hematuria.  His primary complaint is nocturia x 3. IPSS = 9/4.  Portions of the above documentation were copied from a prior visit for review purposes only.   Past Medical History:  Past Medical History:  Diagnosis Date   AAA (abdominal aortic aneurysm) (HCC)    Aortic dissection (HCC)    Diabetes mellitus (HCC)    Dysrhythmia    History of kidney stones    Hyperlipidemia    Hypertension    Kidney stone    Nosebleed     Past Surgical History:  Past Surgical History:  Procedure Laterality Date   CYSTOSCOPY     HERNIA REPAIR     HYDROCELE EXCISION Left 11/21/2023   Procedure: HYDROCELECTOMY;  Surgeon: Mellie Sprinkle., MD;  Location: [facility];  Service: Urology;  Laterality: Left;   KIDNEY STONE SURGERY     LITHOTRIPSY      Allergies:  No Known  Allergies  Family History:  Family History  Problem Relation Age of Onset   Hypertension Mother     Social History:  Social History   Tobacco Use   Smoking status: Former   Smokeless tobacco: Never  Advertising account planner   Vaping status: Never Used  Substance Use Topics   Alcohol use: Not Currently   Drug use: Yes    Types: Marijuana    ROS: Constitutional:  Negative for fever, chills, weight loss CV: Negative for chest pain, previous MI, hypertension Respiratory:  Negative for shortness of breath, wheezing, sleep apnea, frequent cough GI:  Negative for nausea, vomiting, bloody stool, GERD  Physical exam: BP 121/69   Pulse 67   Ht 5' 10 (1.778 m)   Wt 225 lb (102.1 kg)   BMI 32.28 kg/m  GU: Scrotal incision well-healed; no significant left scrotal swelling.  Results: U/A: 0-5 WBCs, 0-2 RBCs

## 2024-01-10 ENCOUNTER — Other Ambulatory Visit: Payer: Self-pay | Admitting: Family Medicine

## 2024-01-10 DIAGNOSIS — I10 Essential (primary) hypertension: Secondary | ICD-10-CM

## 2024-01-10 DIAGNOSIS — I7102 Dissection of abdominal aorta: Secondary | ICD-10-CM

## 2024-01-10 DIAGNOSIS — E1165 Type 2 diabetes mellitus with hyperglycemia: Secondary | ICD-10-CM

## 2024-01-14 ENCOUNTER — Other Ambulatory Visit: Payer: Self-pay | Admitting: Family Medicine

## 2024-01-14 DIAGNOSIS — I10 Essential (primary) hypertension: Secondary | ICD-10-CM

## 2024-02-05 ENCOUNTER — Other Ambulatory Visit: Payer: Self-pay | Admitting: Family Medicine

## 2024-02-05 DIAGNOSIS — I7102 Dissection of abdominal aorta: Secondary | ICD-10-CM

## 2024-02-05 DIAGNOSIS — I10 Essential (primary) hypertension: Secondary | ICD-10-CM

## 2024-02-13 ENCOUNTER — Other Ambulatory Visit: Payer: Self-pay | Admitting: Family Medicine

## 2024-02-13 DIAGNOSIS — I10 Essential (primary) hypertension: Secondary | ICD-10-CM

## 2024-02-19 ENCOUNTER — Other Ambulatory Visit: Payer: Self-pay | Admitting: Family Medicine

## 2024-02-19 DIAGNOSIS — I10 Essential (primary) hypertension: Secondary | ICD-10-CM

## 2024-02-19 DIAGNOSIS — E1165 Type 2 diabetes mellitus with hyperglycemia: Secondary | ICD-10-CM

## 2024-02-19 DIAGNOSIS — Z794 Long term (current) use of insulin: Secondary | ICD-10-CM

## 2024-02-21 ENCOUNTER — Encounter: Payer: Self-pay | Admitting: Family Medicine

## 2024-02-21 ENCOUNTER — Ambulatory Visit (INDEPENDENT_AMBULATORY_CARE_PROVIDER_SITE_OTHER): Admitting: Family Medicine

## 2024-02-21 VITALS — BP 131/65 | HR 66 | Ht 70.0 in | Wt 229.0 lb

## 2024-02-21 DIAGNOSIS — I10 Essential (primary) hypertension: Secondary | ICD-10-CM

## 2024-02-21 DIAGNOSIS — G4739 Other sleep apnea: Secondary | ICD-10-CM

## 2024-02-21 DIAGNOSIS — Z794 Long term (current) use of insulin: Secondary | ICD-10-CM

## 2024-02-21 DIAGNOSIS — I7102 Dissection of abdominal aorta: Secondary | ICD-10-CM

## 2024-02-21 DIAGNOSIS — E1165 Type 2 diabetes mellitus with hyperglycemia: Secondary | ICD-10-CM | POA: Diagnosis not present

## 2024-02-21 DIAGNOSIS — E781 Pure hyperglyceridemia: Secondary | ICD-10-CM | POA: Diagnosis not present

## 2024-02-21 DIAGNOSIS — N529 Male erectile dysfunction, unspecified: Secondary | ICD-10-CM

## 2024-02-21 MED ORDER — FREESTYLE LIBRE 3 PLUS SENSOR MISC
3 refills | Status: AC
Start: 2024-02-21 — End: ?

## 2024-02-21 MED ORDER — CARVEDILOL 25 MG PO TABS: 25.0000 mg | ORAL_TABLET | Freq: Two times a day (BID) | ORAL | 1 refills | Status: DC

## 2024-02-21 MED ORDER — CARVEDILOL 25 MG PO TABS: ORAL_TABLET | ORAL | 1 refills | Status: DC

## 2024-02-21 MED ORDER — ATORVASTATIN CALCIUM 20 MG PO TABS: 20.0000 mg | ORAL_TABLET | Freq: Every day | ORAL | 0 refills | Status: DC

## 2024-02-21 MED ORDER — METFORMIN HCL ER 500 MG PO TB24: ORAL_TABLET | ORAL | 3 refills | Status: AC

## 2024-02-21 MED ORDER — LOSARTAN POTASSIUM 100 MG PO TABS: 100.0000 mg | ORAL_TABLET | Freq: Every day | ORAL | 1 refills | Status: AC

## 2024-02-21 MED ORDER — AMLODIPINE BESYLATE 5 MG PO TABS: 5.0000 mg | ORAL_TABLET | Freq: Every day | ORAL | 1 refills | Status: DC

## 2024-02-21 MED ORDER — SPIRONOLACTONE 25 MG PO TABS: 25.0000 mg | ORAL_TABLET | Freq: Every day | ORAL | 1 refills | Status: AC

## 2024-02-21 MED ORDER — SILDENAFIL CITRATE 50 MG PO TABS: 50.0000 mg | ORAL_TABLET | Freq: Every day | ORAL | 2 refills | Status: AC | PRN

## 2024-02-21 MED ORDER — CLONIDINE HCL 0.1 MG PO TABS: 0.1000 mg | ORAL_TABLET | Freq: Two times a day (BID) | ORAL | 1 refills | Status: AC

## 2024-02-21 NOTE — Assessment & Plan Note (Signed)
 Close BP control.  Lipid management. Following with Vascular and Cardiology  Asymptomatic currently

## 2024-02-21 NOTE — Assessment & Plan Note (Signed)
 Currently on 16 units of insulin in the morning and Metformin 500mg  in the morning and 1,000 mg in the evening -Check A1c  -Continue current regimen and monitor glucose levels with Freestyle Libre. -Continue lifestyle measures

## 2024-02-21 NOTE — Assessment & Plan Note (Signed)
 Continue current meds Following with cardiology/vascular Labs ordered

## 2024-02-21 NOTE — Assessment & Plan Note (Signed)
 Requesting a new referral for sleep study

## 2024-02-21 NOTE — Assessment & Plan Note (Signed)
 Blood pressure is at goal for age and co-morbidities.   Recommendations: continue current regimen and specialist follow-up - BP goal <130/80 - monitor and log blood pressures at home - check around the same time each day in a relaxed setting - Limit salt to <2000 mg/day - Follow DASH eating plan (heart healthy diet) - limit alcohol to 2 standard drinks per day for men and 1 per day for women - avoid tobacco products - get at least 2 hours of regular aerobic exercise weekly Patient aware of signs/symptoms requiring further/urgent evaluation. Labs ordered

## 2024-02-21 NOTE — Progress Notes (Signed)
 Established Patient Office Visit  Subjective   Patient ID: Patrick [name], male    DOB: 01-21-1966  Age: 58 y.o. MRN: [id number]  Chief Complaint  Patient presents with   Medical Management of Chronic Issues   Diabetes     Patient is here for follow-up, chronic disease management. He established here in April 2024 after hospitalization for pancreatitis, uncontrolled diabetes, and abdominal aortic dissection. Reports he has been doing well. No acute concerns. He has enjoyed having his college-aged kids home for the summer!    Hypertension, Dissecting AAA (BP goal <120/80), Hypertriglyceridemia:  - Medications: amlodipine  5 mg BID, carvedilol  25 mg BID (sometimes takes an extra at bedtime if high), clonidine  0.1 mg BID, hydralazine  100 mg TID (usually only takes twice daily, mid-day dose was giving him a headache), losartan  100 mg daily, spironolactone  25 mg daily, aspirin 81 mg daily, atorvastatin  20 mg daily, fenofibrate  160 mg daily,  - Compliance: good - Checking BP at home: yes, fluctuates - Denies any SOB, recurrent headaches, CP, vision changes, LE edema, dizziness, palpitations, or medication side effects. - Diet: heart healthy, low carb, no alcohol - Exercise: walking daily - 11/01/22 Vascular Follow-up (Dr. Magda): Repeat CT showed stable AAA (47 mm), but incidentally discovered a stable splenic lesion and central mesenteric lesion (present for some time), suspected to be reactive lymph node - will need to monitor. Also noted indeterminate ground-glass opacity to posterior left lung - recommending 3-6 month non contrast CT and repeat at 18-24 months if stable after initial repeat. Planning for abdominal aortic aneurysm duplex around November 2024. - 01/27/23 Cardiology follow-up with Dr. Pietro: Echo ordered, no other changes at that time - 03/27/23 Echo: normal LV function   Diabetes: - Checking glucose at home: yes, Libre  - Medications: metformin  500 mg in the morning 1,000 in  the evening, Lantus  16 units daily - Compliance: good - Eye exam: UTD, requested  - Foot exam:  - Microalbumin: today - Denies symptoms of hypoglycemia, polyuria, polydipsia, numbness extremities, foot ulcers/trauma, wounds that are not healing, medication side effects  - Currently avoiding GLP-1 due to recent pancreatitis  Lab Results  Component Value Date   HGBA1C 6.8 (H) 11/09/2023    Patient is requesting a new pulm referral for sleep study - frequent wakings, fatigue, obesity.    ROS All review of systems negative except what is listed in the HPI    Objective:     BP 131/65   Pulse 66   Ht 5' 10 (1.778 m)   Wt 229 lb (103.9 kg)   SpO2 97%   BMI 32.86 kg/m    Physical Exam Vitals reviewed.  Constitutional:      Appearance: Normal appearance.  Cardiovascular:     Rate and Rhythm: Normal rate and regular rhythm.     Pulses: Normal pulses.     Heart sounds: Normal heart sounds.  Pulmonary:     Effort: Pulmonary effort is normal.     Breath sounds: Normal breath sounds.  Musculoskeletal:     Right lower leg: No edema.     Left lower leg: No edema.  Skin:    General: Skin is warm and dry.  Neurological:     Mental Status: He is alert and oriented to person, place, and time.  Psychiatric:        Mood and Affect: Mood normal.        Behavior: Behavior normal.        Thought Content: Thought  content normal.        Judgment: Judgment normal.      No results found for any visits on 02/21/24.    The 10-year ASCVD risk score (Arnett DK, et al., 2019) is: 17.9%    Assessment & Plan:   Problem List Items Addressed This Visit       Active Problems   Dissecting AAA (abdominal aortic aneurysm) (HCC) (Chronic)   Close BP control.  Lipid management. Following with Vascular and Cardiology   Asymptomatic currently       Relevant Medications   spironolactone  (ALDACTONE ) 25 MG tablet   cloNIDine  (CATAPRES ) 0.1 MG tablet   sildenafil  (VIAGRA ) 50 MG tablet    atorvastatin  (LIPITOR) 20 MG tablet   amLODipine  (NORVASC ) 5 MG tablet   losartan  (COZAAR ) 100 MG tablet   carvedilol  (COREG ) 25 MG tablet   Essential hypertension (Chronic)   Blood pressure is at goal for age and co-morbidities.   Recommendations: continue current regimen and specialist follow-up - BP goal <130/80 - monitor and log blood pressures at home - check around the same time each day in a relaxed setting - Limit salt to <2000 mg/day - Follow DASH eating plan (heart healthy diet) - limit alcohol to 2 standard drinks per day for men and 1 per day for women - avoid tobacco products - get at least 2 hours of regular aerobic exercise weekly Patient aware of signs/symptoms requiring further/urgent evaluation. Labs ordered       Relevant Medications   spironolactone  (ALDACTONE ) 25 MG tablet   cloNIDine  (CATAPRES ) 0.1 MG tablet   sildenafil  (VIAGRA ) 50 MG tablet   atorvastatin  (LIPITOR) 20 MG tablet   amLODipine  (NORVASC ) 5 MG tablet   losartan  (COZAAR ) 100 MG tablet   carvedilol  (COREG ) 25 MG tablet   Other Relevant Orders   Comprehensive metabolic panel with GFR   Type 2 diabetes mellitus with hyperglycemia, with long-term current use of insulin  (HCC) - Primary (Chronic)   Currently on 16 units of insulin  in the morning and Metformin  500mg  in the morning and 1,000 mg in the evening -Check A1c  -Continue current regimen and monitor glucose levels with Freestyle Libre. -Continue lifestyle measures       Relevant Medications   Continuous Glucose Sensor (FREESTYLE LIBRE 3 PLUS SENSOR) MISC   metFORMIN  (GLUCOPHAGE -XR) 500 MG 24 hr tablet   atorvastatin  (LIPITOR) 20 MG tablet   losartan  (COZAAR ) 100 MG tablet   Other Relevant Orders   Comprehensive metabolic panel with GFR   Hemoglobin A1c   Microalbumin / creatinine urine ratio   Hypertriglyceridemia (Chronic)   Continue current meds Following with cardiology/vascular Labs ordered      Relevant Medications    spironolactone  (ALDACTONE ) 25 MG tablet   cloNIDine  (CATAPRES ) 0.1 MG tablet   sildenafil  (VIAGRA ) 50 MG tablet   atorvastatin  (LIPITOR) 20 MG tablet   amLODipine  (NORVASC ) 5 MG tablet   losartan  (COZAAR ) 100 MG tablet   carvedilol  (COREG ) 25 MG tablet   Other Relevant Orders   Lipid panel   Sleep apnea-like behavior   Requesting a new referral for sleep study       Relevant Orders   Ambulatory referral to Pulmonology   Other Visit Diagnoses       Erectile dysfunction, unspecified erectile dysfunction type       Relevant Medications   sildenafil  (VIAGRA ) 50 MG tablet          Return in about 6 months (around 08/23/2024) for physical; lab  appoitment next week.    Waddell KATHEE Mon, NP

## 2024-02-26 ENCOUNTER — Other Ambulatory Visit (INDEPENDENT_AMBULATORY_CARE_PROVIDER_SITE_OTHER)

## 2024-02-26 DIAGNOSIS — Z794 Long term (current) use of insulin: Secondary | ICD-10-CM

## 2024-02-26 DIAGNOSIS — I10 Essential (primary) hypertension: Secondary | ICD-10-CM

## 2024-02-26 DIAGNOSIS — E1165 Type 2 diabetes mellitus with hyperglycemia: Secondary | ICD-10-CM

## 2024-02-26 DIAGNOSIS — E781 Pure hyperglyceridemia: Secondary | ICD-10-CM | POA: Diagnosis not present

## 2024-02-26 LAB — COMPREHENSIVE METABOLIC PANEL WITH GFR
ALT: 17 U/L (ref 0–53)
AST: 14 U/L (ref 0–37)
Albumin: 4.5 g/dL (ref 3.5–5.2)
Alkaline Phosphatase: 58 U/L (ref 39–117)
BUN: 24 mg/dL — ABNORMAL HIGH (ref 6–23)
CO2: 24 meq/L (ref 19–32)
Calcium: 9.2 mg/dL (ref 8.4–10.5)
Chloride: 106 meq/L (ref 96–112)
Creatinine, Ser: 1.04 mg/dL (ref 0.40–1.50)
GFR: 79.03 mL/min (ref >60.00)
Glucose, Bld: 200 mg/dL — ABNORMAL HIGH (ref 70–99)
Potassium: 4.2 meq/L (ref 3.5–5.1)
Sodium: 140 meq/L (ref 135–145)
Total Bilirubin: 0.5 mg/dL (ref 0.2–1.2)
Total Protein: 6.6 g/dL (ref 6.0–8.3)

## 2024-02-26 LAB — LIPID PANEL
Cholesterol: 110 mg/dL (ref 0–200)
HDL: 23 mg/dL — ABNORMAL LOW (ref >39.00)
LDL Cholesterol: 40 mg/dL (ref 0–99)
NonHDL: 86.8
Total CHOL/HDL Ratio: 5
Triglycerides: 233 mg/dL — ABNORMAL HIGH (ref 0.0–149.0)
VLDL: 46.6 mg/dL — ABNORMAL HIGH (ref 0.0–40.0)

## 2024-02-26 LAB — MICROALBUMIN / CREATININE URINE RATIO
Creatinine,U: 119.4 mg/dL
Microalb Creat Ratio: 10.7 mg/g (ref 0.0–30.0)
Microalb, Ur: 1.3 mg/dL (ref 0.0–1.9)

## 2024-02-26 LAB — HEMOGLOBIN A1C: Hgb A1c MFr Bld: 7.6 % — ABNORMAL HIGH (ref 4.6–6.5)

## 2024-02-26 NOTE — Progress Notes (Signed)
 SABRA

## 2024-02-28 ENCOUNTER — Ambulatory Visit: Payer: Self-pay | Admitting: Family Medicine

## 2024-02-28 DIAGNOSIS — I10 Essential (primary) hypertension: Secondary | ICD-10-CM

## 2024-02-28 MED ORDER — FENOFIBRATE 160 MG PO TABS: 160.0000 mg | ORAL_TABLET | Freq: Every day | ORAL | 1 refills | Status: AC

## 2024-03-23 ENCOUNTER — Other Ambulatory Visit: Payer: Self-pay | Admitting: Family Medicine

## 2024-03-23 DIAGNOSIS — I7102 Dissection of abdominal aorta: Secondary | ICD-10-CM

## 2024-03-23 DIAGNOSIS — I10 Essential (primary) hypertension: Secondary | ICD-10-CM

## 2024-03-30 ENCOUNTER — Other Ambulatory Visit: Payer: Self-pay | Admitting: Family Medicine

## 2024-03-30 DIAGNOSIS — Z794 Long term (current) use of insulin: Secondary | ICD-10-CM

## 2024-04-01 ENCOUNTER — Encounter: Payer: Self-pay | Admitting: Urology

## 2024-04-01 ENCOUNTER — Ambulatory Visit (INDEPENDENT_AMBULATORY_CARE_PROVIDER_SITE_OTHER): Admitting: Urology

## 2024-04-01 VITALS — BP 126/75 | HR 57 | Ht 70.0 in | Wt 226.0 lb

## 2024-04-01 DIAGNOSIS — N433 Hydrocele, unspecified: Secondary | ICD-10-CM

## 2024-04-01 DIAGNOSIS — Z125 Encounter for screening for malignant neoplasm of prostate: Secondary | ICD-10-CM | POA: Diagnosis not present

## 2024-04-01 DIAGNOSIS — R399 Unspecified symptoms and signs involving the genitourinary system: Secondary | ICD-10-CM

## 2024-04-01 LAB — URINALYSIS, ROUTINE W REFLEX MICROSCOPIC
Bilirubin, UA: NEGATIVE
Glucose, UA: NEGATIVE
Ketones, UA: NEGATIVE
Leukocytes,UA: NEGATIVE
Nitrite, UA: NEGATIVE
Protein,UA: NEGATIVE
RBC, UA: NEGATIVE
Specific Gravity, UA: 1.02 (ref 1.005–1.030)
Urobilinogen, Ur: 0.2 mg/dL (ref 0.2–1.0)
pH, UA: 5.5 (ref 5.0–7.5)

## 2024-04-01 NOTE — Progress Notes (Signed)
 Assessment: 1. Left hydrocele; s/p left hydrocelectomy 11/21/23   2. Lower urinary tract symptoms   3. Prostate cancer screening     Plan: PSA today Return to office in 1 year   Chief Complaint:  Chief Complaint  Patient presents with   Hydrocele    History of Present Illness:  Patrick [name] is a 58 y.o. male who is seen for further evaluation of left hydrocele and LUTS. He noted onset of left scrotal enlargement approximately 6 months ago with gradual increase in size.  No pain associated with this.  No history of scrotal infection or trauma. He does not have any significant lower urinary tract symptoms other than occasional frequency.  No dysuria or gross hematuria. IPSS = 8.  Scrotal ultrasound from 10/02/2023 showed a large left hydrocele measuring 10.8 x 7.6 x 9.0 cm with mobile internal debris.  He is s/p left hydrocelectomy on 11/21/23.  Path consistent with hydrocele sac.  At his visit in June 2025, he was doing very well.  He was not having any significant scrotal swelling or pain. He continued to have some lower urinary tract symptoms with frequency, urgency, and nocturia x 3.  No dysuria or gross hematuria.  His primary complaint was nocturia x 3. IPSS = 9/4.  He returns today for follow-up.  He continues to do well following his hydrocele surgery.  He is not having any scrotal swelling or pain.  He continues to have some lower urinary tract symptoms with urgency and nocturia x 4.  No dysuria or gross hematuria. IPSS = 10/4.  Portions of the above documentation were copied from a prior visit for review purposes only.   Past Medical History:  Past Medical History:  Diagnosis Date   AAA (abdominal aortic aneurysm) (HCC)    Aortic dissection (HCC)    Diabetes mellitus (HCC)    Dysrhythmia    History of kidney stones    Hyperlipidemia    Hypertension    Kidney stone    Nosebleed     Past Surgical History:  Past Surgical History:  Procedure Laterality Date    CYSTOSCOPY     HERNIA REPAIR     HYDROCELE EXCISION Left 11/21/2023   Procedure: HYDROCELECTOMY;  Surgeon: Roseann Adine PARAS., MD;  Location: [facility];  Service: Urology;  Laterality: Left;   KIDNEY STONE SURGERY     LITHOTRIPSY      Allergies:  No Known Allergies  Family History:  Family History  Problem Relation Age of Onset   Hypertension Mother     Social History:  Social History   Tobacco Use   Smoking status: Former   Smokeless tobacco: Never  Advertising account planner   Vaping status: Never Used  Substance Use Topics   Alcohol use: Not Currently   Drug use: Yes    Types: Marijuana    ROS: Constitutional:  Negative for fever, chills, weight loss CV: Negative for chest pain, previous MI, hypertension Respiratory:  Negative for shortness of breath, wheezing, sleep apnea, frequent cough GI:  Negative for nausea, vomiting, bloody stool, GERD  Physical exam: BP 126/75   Pulse (!) 57   Ht 5' 10 (1.778 m)   Wt 226 lb (102.5 kg)   BMI 32.43 kg/m  GENERAL APPEARANCE:  Well appearing, well developed, well nourished, NAD HEENT:  Atraumatic, normocephalic, oropharynx clear NECK:  Supple without lymphadenopathy or thyromegaly ABDOMEN:  Soft, non-tender, no masses EXTREMITIES:  Moves all extremities well, without clubbing, cyanosis, or edema NEUROLOGIC:  Alert  and oriented x 3, normal gait, CN II-XII grossly intact MENTAL STATUS:  appropriate BACK:  Non-tender to palpation, No CVAT SKIN:  Warm, dry, and intact GU: Scrotum: No scrotal erythema or edema Testis: normal without masses bilateral Prostate: 30 g, nontender, no nodules Rectum: Normal tone,  no masses or tenderness  Results: U/A: Negative

## 2024-04-02 ENCOUNTER — Ambulatory Visit: Payer: Self-pay | Admitting: Urology

## 2024-04-02 LAB — PSA: Prostate Specific Ag, Serum: 1.1 ng/mL (ref 0.0–4.0)

## 2024-04-10 ENCOUNTER — Ambulatory Visit (INDEPENDENT_AMBULATORY_CARE_PROVIDER_SITE_OTHER): Admitting: Adult Health

## 2024-04-10 ENCOUNTER — Encounter: Payer: Self-pay | Admitting: Adult Health

## 2024-04-10 VITALS — BP 108/60 | HR 56 | Temp 97.7°F | Ht 70.0 in | Wt 230.4 lb

## 2024-04-10 DIAGNOSIS — R0683 Snoring: Secondary | ICD-10-CM

## 2024-04-10 DIAGNOSIS — Z6833 Body mass index (BMI) 33.0-33.9, adult: Secondary | ICD-10-CM

## 2024-04-10 DIAGNOSIS — R911 Solitary pulmonary nodule: Secondary | ICD-10-CM

## 2024-04-10 DIAGNOSIS — J309 Allergic rhinitis, unspecified: Secondary | ICD-10-CM

## 2024-04-10 NOTE — Progress Notes (Signed)
 @Patient  ID: Patrick [name], male    DOB: 04-24-1966, 58 y.o.   MRN: [id number]  Chief Complaint  Patient presents with   Consult    Referring provider: Almarie Waddell NOVAK, NP  HPI: 58 year old male seen for sleep consult April 10, 2024 for snoring and restless sleep    TEST/EVENTS :  Discussed the use of AI scribe software for clinical note transcription with the patient, who gave verbal consent to proceed.  History of Present Illness Patrick [name] is a 58 year old male who presents with frequent nocturnal awakenings to urinate.  He experiences frequent nocturnal awakenings to urinate, occurring two to three times per night for an unspecified duration. He snores at night, as noted by his wife, and wakes up with a dry mouth and throat, often needing to drink water, which leads to more frequent urination.  He experiences daytime fatigue, particularly when sitting still, such as when working at his computer. He occasionally takes short naps during the day, a recent development over the past year. Despite consistently waking up at 6 AM for the past forty years, he feels tired during the day. He denies using any sleep aids and reports falling asleep easily at night, typically around 9:30 to 10:00 PM.  Will sometimes fall asleep when he is watching TV Epworth score is 10 out of 24.  Typically gets sleepy if he sits down to watch TV, rest and in the evening hours.  He has had no previous sleep study.  Gets about 1 cup of caffeine daily.  No sleep aids.  Weight is up over the last couple years.  Current weight is at 230 pounds with a BMI 33 no symptoms suspicious for cataplexy or sleep paralysis.  No history of congestive heart failure or stroke.  No removable dental work.  His past medical history includes hypertension, diabetes, high cholesterol, an abdominal aortic aneurysm being monitored , kidney stones, He also had an episode of acute pancreatitis in 2023, which led to the discovery of the AAA.   Notable scattered lung nodules. Most recent CT scan December 2024 showed stable scattered small pulmonary nodules, ground glass with central cavitation in the subpleural left lower lobe stable since April 2024  He has a history of smoking, having quit approximately ten years ago after smoking a pack a week intermittently for about twenty-five years. He does not currently smoke or drink alcohol, having stopped drinking in 2023 after the pancreatitis episode. He occasionally uses marijuana. He lives with his wife and has four children who are no longer living at home. 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  Works dayshift  Family history positive for hypertension  He denies any hemoptysis, unintentional weight loss, cough or wheezing.  Says it does take him longer to get over colds but currently has no respiratory symptoms     No Known Allergies  Immunization History  Administered Date(s) Administered   Janssen (J&J) SARS-COV-2 Vaccination 11/25/2019   Pfizer(Comirnaty)Fall Seasonal Vaccine 12 years and older 10/04/2021   Tdap 09/15/2009    Past Medical History:  Diagnosis Date   AAA (abdominal aortic aneurysm)    Aortic dissection (HCC)    Diabetes mellitus (HCC)    Dysrhythmia    History of kidney stones    Hyperlipidemia    Hypertension    Kidney stone    Nosebleed     Tobacco History: Social History   Tobacco Use  Smoking Status Former  Smokeless Tobacco Never  Tobacco Comments   Smokes marijuana occass.   Counseling given: Not Answered Tobacco comments: Smokes marijuana occass.   Outpatient Medications Prior to Visit  Medication Sig Dispense Refill   amLODipine  (NORVASC ) 5 MG tablet Take 1 tablet (5 mg total) by mouth daily. 90 tablet 1   atorvastatin  (LIPITOR) 20 MG tablet TAKE 1 TABLET BY MOUTH DAILY 90 tablet 3   b complex vitamins capsule Take 1 capsule by mouth daily.     carvedilol  (COREG ) 25 MG tablet Take 1 tablet (25 mg  total) by mouth 2 (two) times daily with a meal. 180 tablet 1   cloNIDine  (CATAPRES ) 0.1 MG tablet Take 1 tablet (0.1 mg total) by mouth 2 (two) times daily. 180 tablet 1   Continuous Glucose Sensor (FREESTYLE LIBRE 3 PLUS SENSOR) MISC Change sensor every 15 days. 6 each 3   fenofibrate  160 MG tablet Take 1 tablet (160 mg total) by mouth daily. 90 tablet 1   folic acid  (FOLVITE ) 1 MG tablet Take 1 mg by mouth daily.     hydrALAZINE  (APRESOLINE ) 100 MG tablet TAKE 1 TABLET BY MOUTH EVERY 8 HOURS **APPOINTMENT NEEDED FOR FURTHER REFILLS** 90 tablet 0   HYDROcodone -acetaminophen  (NORCO/VICODIN) 5-325 MG tablet Take 1 tablet by mouth every 6 (six) hours as needed for moderate pain (pain score 4-6). 15 tablet 0   Insulin  Glargine (BASAGLAR  KWIKPEN) 100 UNIT/ML INJECT 16 UNITS UNDER THE SKIN DAILY 6 mL 0   Insulin  Pen Needle (DROPLET PEN NEEDLES) 32G X 4 MM MISC Use w/ insulin . Needs appt 100 each 0   losartan  (COZAAR ) 100 MG tablet Take 1 tablet (100 mg total) by mouth daily. 90 tablet 1   metFORMIN  (GLUCOPHAGE -XR) 500 MG 24 hr tablet TAKE ONE TABLET BY MOUTH EVERY MORNING AND TAKE TWO TABLETS EVERY EVENING 270 tablet 3   Multiple Vitamin (MULTIVITAMIN WITH MINERALS) TABS tablet Take 1 tablet by mouth daily.     Omega-3 Fatty Acids (FISH OIL TRIPLE STRENGTH) 1400 MG CAPS Take 1,400 mg by mouth daily.     Psyllium (METAMUCIL PO) Take 2 Scoops by mouth daily.     sildenafil  (VIAGRA ) 50 MG tablet Take 1 tablet (50 mg total) by mouth daily as needed. 10 tablet 2   spironolactone  (ALDACTONE ) 25 MG tablet Take 1 tablet (25 mg total) by mouth daily. 90 tablet 1   No facility-administered medications prior to visit.     Review of Systems:   Constitutional:   No  weight loss, night sweats,  Fevers, chills,+ fatigue, or  lassitude.  HEENT:   No headaches,  Difficulty swallowing,  Tooth/dental problems, or  Sore throat,                No sneezing, itching, ear ache, nasal congestion, post nasal drip,    CV:  No chest pain,  Orthopnea, PND, swelling in lower extremities, anasarca, dizziness, palpitations, syncope.   GI  No heartburn, indigestion, abdominal pain, nausea, vomiting, diarrhea, change in bowel habits, loss of appetite, bloody stools.   Resp: No shortness of breath with exertion or at rest.  No excess mucus, no productive cough,  No non-productive cough,  No coughing up of blood.  No change in color of mucus.  No wheezing.  No chest wall deformity  Skin: no rash or lesions.  GU: no dysuria, change in color of urine, no urgency or frequency.  No flank pain, no hematuria   MS:  No joint pain or swelling.  No decreased range of motion.  No back pain.    Physical Exam  BP 108/60 (BP Location: Left Arm, Patient Position: Sitting, Cuff Size: Large)   Pulse (!) 56   Temp 97.7 F (36.5 C) (Oral)   Ht 5' 10 (1.778 m)   Wt 230 lb 6.4 oz (104.5 kg)   SpO2 97%   BMI 33.06 kg/m   GEN: A/Ox3; pleasant , NAD, well nourished    HEENT:  [place]/AT,  NOSE-clear, THROAT-clear, no lesions, no postnasal drip or exudate noted.  Class III MP airway  NECK:  Supple w/ fair ROM; no JVD; normal carotid impulses w/o bruits; no thyromegaly or nodules palpated; no lymphadenopathy.    RESP  Clear  P & A; w/o, wheezes/ rales/ or rhonchi. no accessory muscle use, no dullness to percussion  CARD:  RRR, no m/r/g, no peripheral edema, pulses intact, no cyanosis or clubbing.  GI:   Soft & nt; nml bowel sounds; no organomegaly or masses detected.   Musco: Warm bil, no deformities or joint swelling noted.   Neuro: alert, no focal deficits noted.    Skin: Warm, no lesions or rashes      BNP No results found for: BNP  ProBNP No results found for: PROBNP  Imaging: No results found.  Administration History     None           No data to display          No results found for: NITRICOXIDE      Assessment & Plan:   Assessment and Plan Assessment & Plan Suspected  obstructive sleep apnea   Frequent nocturnal awakenings, daytime fatigue, snoring, and daytime somnolence suggest obstructive sleep apnea. Untreated, it risks cardiovascular complications, exacerbation of hypertension and diabetes, and increased risk congestive heart failure, and atrial fibrillation, potentially leading to stroke. Order a home sleep study through [facility]. Follow up in six weeks to review results. - discussed how weight can impact sleep and risk for sleep disordered breathing - discussed options to assist with weight loss: combination of diet modification, cardiovascular and strength training exercises   - had an extensive discussion regarding the adverse health consequences related to untreated sleep disordered breathing - specifically discussed the risks for hypertension, coronary artery disease, cardiac dysrhythmias, cerebrovascular disease, and diabetes - lifestyle modification discussed   - discussed how sleep disruption can increase risk of accidents, particularly when driving - safe driving practices were discussed    Chronic nasal congestion and mouth breathing   Chronic nasal congestion causes mouth breathing and dry mouth, possibly due to allergies. Recommend continued use of saline spray and trial of Claritin at bedtime. Consider adding Flonase if needed.  Pulmonary nodules, history of smoking greater than 20 years.  Incidental finding on CT imaging.  Follow-up CT scan December 2024 showed stable pulmonary nodules and left lower lobe ground glass opacity with central cavitation.  Would set patient up for a serial CT imaging for 1 year follow-up-order placed for CT chest towards the end of November 2025  No symptoms like cough, hemoptysis, or weight loss reported. Order a CT chest in Nov 2025 to monitor nodules.  Type 2 diabetes mellitus, hyperlipidemia, hypertension continue follow-up with primary care  Abdominal aortic aneurysm,  An abdominal aortic aneurysm  is being monitored on serial imaging continue follow-up with primary care   Morbid obesity with BMI 33.  Continue with healthy weight loss  Plan Patient Instructions  Set up for home sleep study.  Work on healthy weight  Do not drive if sleepy.  Saline nasal gel At bedtime   Claritin 10mg  At bedtime  As needed   Flonase nasal 2 puffs daily As needed   CT chest in November  Follow up in 6 weeks and As needed           Madelin Stank, NP 04/10/2024

## 2024-04-10 NOTE — Patient Instructions (Addendum)
 Set up for home sleep study.  Work on healthy weight  Do not drive if sleepy.  Saline nasal gel At bedtime   Claritin 10mg  At bedtime  As needed   Flonase nasal 2 puffs daily As needed   CT chest in November  Follow up in 6 weeks and As needed

## 2024-04-17 ENCOUNTER — Encounter: Payer: Self-pay | Admitting: Family Medicine

## 2024-04-21 ENCOUNTER — Other Ambulatory Visit: Payer: Self-pay | Admitting: Family Medicine

## 2024-04-21 DIAGNOSIS — E1165 Type 2 diabetes mellitus with hyperglycemia: Secondary | ICD-10-CM

## 2024-05-01 ENCOUNTER — Encounter

## 2024-05-01 DIAGNOSIS — R0683 Snoring: Secondary | ICD-10-CM

## 2024-05-06 ENCOUNTER — Other Ambulatory Visit: Payer: Self-pay | Admitting: Family Medicine

## 2024-05-06 DIAGNOSIS — Z794 Long term (current) use of insulin: Secondary | ICD-10-CM

## 2024-05-09 ENCOUNTER — Telehealth: Payer: Self-pay | Admitting: Pulmonary Disease

## 2024-05-09 DIAGNOSIS — G4733 Obstructive sleep apnea (adult) (pediatric): Secondary | ICD-10-CM | POA: Diagnosis not present

## 2024-05-09 NOTE — Telephone Encounter (Signed)
 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 Auto CPAP 5-15 with heated humidification and the patient's preferred mask may be considered; other treatment options may include an oral device, watchful waiting with significant weight loss efforts.  Clinical follow-up of symptoms

## 2024-05-09 NOTE — Telephone Encounter (Signed)
 ATC X1. LMTCB

## 2024-05-10 NOTE — Telephone Encounter (Signed)
 I called and spoke with patient, advised of results/recommendations per Dr. Neda.  I schedule a video visit with Tammy on 11/7 at 3 pm, advised to log on at 2:45 pm.  He verbalized understanding.  I also sent the results per Dr. Neda to his mychart for him to review.  Nothing further needed.

## 2024-05-17 ENCOUNTER — Ambulatory Visit: Payer: Self-pay | Admitting: Adult Health

## 2024-05-17 ENCOUNTER — Ambulatory Visit: Admitting: Adult Health

## 2024-05-23 ENCOUNTER — Ambulatory Visit: Admitting: Adult Health

## 2024-05-23 ENCOUNTER — Telehealth: Payer: Self-pay

## 2024-05-23 NOTE — Telephone Encounter (Signed)
 ATC LVMTCB Per Tammy Parrett, pt's virtual appt needs to be moved to double book at 11:30 on 05/24/24 or to 3:30 on 05/31/24

## 2024-05-24 ENCOUNTER — Telehealth (INDEPENDENT_AMBULATORY_CARE_PROVIDER_SITE_OTHER): Admitting: Adult Health

## 2024-05-24 ENCOUNTER — Encounter: Payer: Self-pay | Admitting: Adult Health

## 2024-05-24 ENCOUNTER — Ambulatory Visit: Admitting: Adult Health

## 2024-05-24 DIAGNOSIS — R911 Solitary pulmonary nodule: Secondary | ICD-10-CM | POA: Diagnosis not present

## 2024-05-24 DIAGNOSIS — G4733 Obstructive sleep apnea (adult) (pediatric): Secondary | ICD-10-CM | POA: Diagnosis not present

## 2024-05-24 NOTE — Patient Instructions (Addendum)
 Work on healthy weight  Do not drive if sleepy.  Sleep with head of bed elevated at 30 degrees.  Saline nasal gel At bedtime   Claritin 10mg  At bedtime  As needed   Flonase nasal 2 puffs daily As needed   CT chest as discussed .  Call back if you change your mind regarding oral appliance /orthodontic referral.  Follow up in 1 year and As needed

## 2024-05-24 NOTE — Progress Notes (Signed)
 Virtual Visit via Video Note  I connected with Patrick [name] on 05/24/24 at 11:30 AM EST by a video enabled telemedicine application and verified that I am speaking with the correct person using two identifiers.  Location: Patient: Home  Provider: Office    I discussed the limitations of evaluation and management by telemedicine and the availability of in person appointments. The patient expressed understanding and agreed to proceed.  History of Present Illness: 58 year old male seen for sleep consult April 10, 2024 for snoring and restless sleep found to have mild obstructive sleep apnea with AHI 7.8/hour and SpO2 low at 86%.  Minimum oxygen desaturations less than 88% noted.  We discussed his sleep study results in detail.  Went over treatment options including weight loss, positional sleep, oral appliance and CPAP therapy.  Patient says he would like to hold off on CPAP therapy if is possible.  For now wants to focus on weight loss and positional sleep.  Does not want to try oral appliance at this time.  We discussed healthy sleep regimen.  Patient is a former smoker.  Patient is followed by a cardiovascular team for known abdominal aortic aneurysm with surveillance imaging.  Previous CT showed a ground glass opacity in the left lung measuring up to 1.5 cm with scattered pulmonary nodules.  Follow-up CT chest showed stable ground glass opacity with central cavitation in the left lower lobe and stable scattered pulmonary nodules.  Patient has been recommended for a 1 year follow-up CT which is due currently. Patient denies any hemoptysis or unintentional weight loss.  Patient complains of nasal congestion and drainage.   Observations/Objective: 05/24/2024 -NAD   Assessment and Plan: Mild obstructive sleep apnea.  We discussed treatment options including weight loss, positional sleep oral appliance and CPAP therapy.  Patient declines oral appliance and CPAP therapy at this time.  Will focus more  on weight loss and positional sleep.  Patient education was given  - discussed how weight can impact sleep and risk for sleep disordered breathing - discussed options to assist with weight loss: combination of diet modification, cardiovascular and strength training exercises   - had an extensive discussion regarding the adverse health consequences related to untreated sleep disordered breathing - specifically discussed the risks for hypertension, coronary artery disease, cardiac dysrhythmias, cerebrovascular disease, and diabetes - lifestyle modification discussed   - discussed how sleep disruption can increase risk of accidents, particularly when driving - safe driving practices were discussed   Morbid obesity.  Current weight 230 pounds we discussed healthy weight loss.  Continue with daily activity with a goal up to 30 minutes daily.  Scattered pulmonary nodules-notable scattered pulmonary nodules and ground glass left lower nodularity-due for CT surveillance imaging.  CT chest is pending  Chronic rhinitis.  May use Flonase and Claritin as needed  Plan  Patient Instructions  Work on healthy weight  Do not drive if sleepy.  Sleep with head of bed elevated at 30 degrees.  Saline nasal gel At bedtime   Claritin 10mg  At bedtime  As needed   Flonase nasal 2 puffs daily As needed   CT chest as discussed .  Call back if you change your mind regarding oral appliance /orthodontic referral.  Follow up in 1 year and As needed      Follow Up Instructions:    I discussed the assessment and treatment plan with the patient. The patient was provided an opportunity to ask questions and all were answered. The patient agreed with  the plan and demonstrated an understanding of the instructions.   The patient was advised to call back or seek an in-person evaluation if the symptoms worsen or if the condition fails to improve as anticipated.  I provided 30  minutes of non-face-to-face time during  this encounter.   Madelin Stank, NP

## 2024-06-10 ENCOUNTER — Telehealth: Payer: Self-pay | Admitting: Adult Health

## 2024-06-10 NOTE — Telephone Encounter (Signed)
 Routing to [employer]

## 2024-06-10 NOTE — Telephone Encounter (Signed)
 CT chest was ordered, do not see that it was scheduled. Can we look into date of CT chest for lung nodule follow up

## 2024-06-11 NOTE — Telephone Encounter (Signed)
 Spoke with patient regarding the Monday 06/17/24 3:30 pm CT chest appointment at [facility]--arrival time is 3:15 pm--readiology department for check in--information is available in My Chart and patient voiced his understanding

## 2024-06-15 ENCOUNTER — Other Ambulatory Visit: Payer: Self-pay | Admitting: Family Medicine

## 2024-06-15 DIAGNOSIS — Z794 Long term (current) use of insulin: Secondary | ICD-10-CM

## 2024-06-17 ENCOUNTER — Ambulatory Visit (HOSPITAL_BASED_OUTPATIENT_CLINIC_OR_DEPARTMENT_OTHER): Admission: RE | Admit: 2024-06-17 | Discharge: 2024-06-17 | Attending: Adult Health | Admitting: Adult Health

## 2024-06-17 DIAGNOSIS — R590 Localized enlarged lymph nodes: Secondary | ICD-10-CM | POA: Diagnosis not present

## 2024-06-17 DIAGNOSIS — R918 Other nonspecific abnormal finding of lung field: Secondary | ICD-10-CM | POA: Diagnosis not present

## 2024-06-17 DIAGNOSIS — R911 Solitary pulmonary nodule: Secondary | ICD-10-CM

## 2024-06-17 DIAGNOSIS — J984 Other disorders of lung: Secondary | ICD-10-CM | POA: Diagnosis not present

## 2024-06-21 ENCOUNTER — Encounter: Payer: Self-pay | Admitting: Family Medicine

## 2024-06-21 DIAGNOSIS — I10 Essential (primary) hypertension: Secondary | ICD-10-CM

## 2024-06-24 MED ORDER — CARVEDILOL 25 MG PO TABS: 25.0000 mg | ORAL_TABLET | Freq: Two times a day (BID) | ORAL | 1 refills | Status: AC

## 2024-06-24 MED ORDER — AMLODIPINE BESYLATE 5 MG PO TABS: 5.0000 mg | ORAL_TABLET | Freq: Two times a day (BID) | ORAL | 1 refills | Status: AC

## 2024-06-24 NOTE — Addendum Note (Signed)
 Addended by: ALMARIE BIRMINGHAM B on: 06/24/2024 04:58 PM   Modules accepted: Orders

## 2024-06-26 ENCOUNTER — Encounter: Payer: Self-pay | Admitting: *Deleted

## 2024-06-26 NOTE — Progress Notes (Signed)
 I called and spoke with patient, provided information per Madelin Stank NP.  He did not have time to schedule the follow up or PFT at the time of my call as he was at work.  He will call back to schedule.  I sent him a mychart message as he requested regarding the PET scan and follow up information.  He verbalized understanding.  Nothing further needed.

## 2024-07-10 ENCOUNTER — Encounter (HOSPITAL_COMMUNITY)
Admission: RE | Admit: 2024-07-10 | Discharge: 2024-07-10 | Disposition: A | Discharge status: Home / Self Care | Source: Ambulatory Visit | Attending: Adult Health | Admitting: Adult Health

## 2024-07-10 DIAGNOSIS — R918 Other nonspecific abnormal finding of lung field: Secondary | ICD-10-CM | POA: Diagnosis not present

## 2024-07-10 LAB — GLUCOSE, CAPILLARY: Glucose-Capillary: 130 mg/dL — ABNORMAL HIGH (ref 70–99)

## 2024-07-10 MED ORDER — FLUDEOXYGLUCOSE F - 18 (FDG) INJECTION
10.0000 | Freq: Once | INTRAVENOUS | Status: AC
  Administered 2024-07-10: 11.22 via INTRAVENOUS

## 2024-07-17 ENCOUNTER — Ambulatory Visit: Admitting: Acute Care

## 2024-07-19 ENCOUNTER — Ambulatory Visit: Payer: Self-pay | Admitting: Adult Health

## 2024-07-19 DIAGNOSIS — R918 Other nonspecific abnormal finding of lung field: Secondary | ICD-10-CM

## 2024-07-22 ENCOUNTER — Ambulatory Visit: Admitting: Family Medicine

## 2024-07-22 VITALS — BP 139/71 | HR 57 | Ht 70.0 in | Wt 228.0 lb

## 2024-07-22 DIAGNOSIS — E1165 Type 2 diabetes mellitus with hyperglycemia: Secondary | ICD-10-CM | POA: Diagnosis not present

## 2024-07-22 DIAGNOSIS — Z7984 Long term (current) use of oral hypoglycemic drugs: Secondary | ICD-10-CM

## 2024-07-22 DIAGNOSIS — Z794 Long term (current) use of insulin: Secondary | ICD-10-CM

## 2024-07-22 DIAGNOSIS — E781 Pure hyperglyceridemia: Secondary | ICD-10-CM | POA: Diagnosis not present

## 2024-07-22 DIAGNOSIS — Z0000 Encounter for general adult medical examination without abnormal findings: Secondary | ICD-10-CM | POA: Diagnosis not present

## 2024-07-22 DIAGNOSIS — I10 Essential (primary) hypertension: Secondary | ICD-10-CM

## 2024-07-22 LAB — CBC WITH DIFFERENTIAL/PLATELET
Basophils Absolute: 0 K/uL (ref 0.0–0.1)
Basophils Relative: 0.4 % (ref 0.0–3.0)
Eosinophils Absolute: 0.2 K/uL (ref 0.0–0.7)
Eosinophils Relative: 2.2 % (ref 0.0–5.0)
HCT: 40.4 % (ref 39.0–52.0)
Hemoglobin: 14.1 g/dL (ref 13.0–17.0)
Lymphocytes Relative: 20.4 % (ref 12.0–46.0)
Lymphs Abs: 1.8 K/uL (ref 0.7–4.0)
MCHC: 35 g/dL (ref 30.0–36.0)
MCV: 85.3 fl (ref 78.0–100.0)
Monocytes Absolute: 0.7 K/uL (ref 0.1–1.0)
Monocytes Relative: 7.7 % (ref 3.0–12.0)
Neutro Abs: 6.1 K/uL (ref 1.4–7.7)
Neutrophils Relative %: 69.3 % (ref 43.0–77.0)
Platelets: 156 K/uL (ref 150.0–400.0)
RBC: 4.74 Mil/uL (ref 4.22–5.81)
RDW: 13.3 % (ref 11.5–15.5)
WBC: 8.8 K/uL (ref 4.0–10.5)

## 2024-07-22 LAB — COMPREHENSIVE METABOLIC PANEL WITH GFR
ALT: 20 U/L (ref 3–53)
AST: 17 U/L (ref 5–37)
Albumin: 4.6 g/dL (ref 3.5–5.2)
Alkaline Phosphatase: 65 U/L (ref 39–117)
BUN: 22 mg/dL (ref 6–23)
CO2: 23 meq/L (ref 19–32)
Calcium: 9.1 mg/dL (ref 8.4–10.5)
Chloride: 107 meq/L (ref 96–112)
Creatinine, Ser: 0.93 mg/dL (ref 0.40–1.50)
GFR: 90.13 mL/min
Glucose, Bld: 147 mg/dL — ABNORMAL HIGH (ref 70–99)
Potassium: 4.2 meq/L (ref 3.5–5.1)
Sodium: 138 meq/L (ref 135–145)
Total Bilirubin: 0.4 mg/dL (ref 0.2–1.2)
Total Protein: 7 g/dL (ref 6.0–8.3)

## 2024-07-22 LAB — TSH: TSH: 2.95 u[IU]/mL (ref 0.35–5.50)

## 2024-07-22 LAB — LIPID PANEL
Cholesterol: 110 mg/dL (ref 28–200)
HDL: 26.5 mg/dL — ABNORMAL LOW
LDL Cholesterol: 43 mg/dL (ref 10–99)
NonHDL: 83.18
Total CHOL/HDL Ratio: 4
Triglycerides: 199 mg/dL — ABNORMAL HIGH (ref 10.0–149.0)
VLDL: 39.8 mg/dL (ref 0.0–40.0)

## 2024-07-22 LAB — HEMOGLOBIN A1C: Hgb A1c MFr Bld: 7.1 % — ABNORMAL HIGH (ref 4.6–6.5)

## 2024-07-22 NOTE — Progress Notes (Signed)
 "  Complete physical exam  Patient: Patrick [name]   DOB: [birth year]   59 y.o. Male  MRN: [id number]  Subjective:    Chief Complaint  Patient presents with   Annual Exam    Patrick [name] is a 59 y.o. male who presents today for a complete physical exam. He reports consuming a low carb heart healthy diet. Home exercise routine includes walking, gym. He generally feels well. He reports sleeping well. He does not have additional problems to discuss today.   Currently lives with: wife Acute concerns or interim problems since last visit: no    Chronic Problems  Hypertension, Dissecting AAA (BP goal <120/80), Hypertriglyceridemia:  - Medications: amlodipine  5 mg BID, carvedilol  25 mg BID (sometimes takes an extra at bedtime if high), clonidine  0.1 mg BID, hydralazine  100 mg TID (usually only takes twice daily, mid-day dose was giving him a headache), losartan  100 mg daily, spironolactone  25 mg daily, aspirin 81 mg daily, atorvastatin  20 mg daily, fenofibrate  160 mg daily,  - Compliance: good - Checking BP at home: controlled - Denies any SOB, recurrent headaches, CP, vision changes, LE edema, dizziness, palpitations, or medication side effects. - Diet: heart healthy, low carb, no alcohol - Exercise: walking daily - 11/01/22 Vascular Follow-up (Dr. Magda): Repeat CT showed stable AAA (47 mm), but incidentally discovered a stable splenic lesion and central mesenteric lesion (present for some time), suspected to be reactive lymph node - will need to monitor. Also noted indeterminate ground-glass opacity to posterior left lung - recommending 3-6 month non contrast CT and repeat at 18-24 months if stable after initial repeat. Planning for abdominal aortic aneurysm duplex around November 2024. - 01/27/23 Cardiology follow-up with Dr. Pietro: Echo ordered, no other changes at that time - 03/27/23 Echo: normal LV function - 05/09/2023 Vascular Follow-up (Dr. Magda): Repeat CT showed stable AAA (47 mm).  Planning 12 month for abdominal aortic aneurysm duplex.     Diabetes: - Checking glucose at home: yes, Libre 95% in range - Medications: metformin  500 mg in the morning 1,000 in the evening, Lantus  16 units daily - Compliance: good - Eye exam: UTD  - Foot exam:  - Microalbumin: UTD - Denies symptoms of hypoglycemia, polyuria, polydipsia, numbness extremities, foot ulcers/trauma, wounds that are not healing, medication side effects  Lab Results  Component Value Date   HGBA1C 7.6 (H) 02/26/2024    Vision concerns: no Dental concerns: n   ETOH use:  Nicotine use: former Recreational drugs/illegal substances: no  06/17/2024 CHEST CT WO CONTRAST: 1. New cluster of anterior left upper lobe nodules, largest 13 mm; recommend non-contrast chest CT at 3 months, PET/CT, or tissue sampling per Fleischner Society Guidelines. 2. Stable 12 mm left lower lobe ground-glass focus with central cavitation. 3. Additional stable nodules measure up to 4 mm.    Most recent fall risk assessment:    07/22/2024    8:38 AM  Fall Risk   Falls in the past year? 0  Number falls in past yr: 0  Injury with Fall? 0  Risk for fall due to : No Fall Risks  Follow up Falls evaluation completed     Most recent depression screenings:    07/22/2024    8:38 AM 02/21/2024    8:07 AM  PHQ 2/9 Scores  PHQ - 2 Score 0 0  PHQ- 9 Score 1              Patient Care Team: Almarie Waddell NOVAK, NP as PCP -  General (Family Medicine)   Show/hide medication list[1]  ROS All review of systems negative except what is listed in the HPI        Objective:     BP 139/71   Pulse (!) 57   Ht 5' 10 (1.778 m)   Wt 228 lb (103.4 kg)   SpO2 99%   BMI 32.71 kg/m    Physical Exam Vitals reviewed.  Constitutional:      General: He is not in acute distress.    Appearance: Normal appearance. He is obese. He is not ill-appearing.  HENT:     Head: Normocephalic and atraumatic.     Right Ear: Tympanic  membrane normal.     Left Ear: Tympanic membrane normal.     Nose: Nose normal.     Mouth/Throat:     Mouth: Mucous membranes are moist.     Pharynx: Oropharynx is clear.  Eyes:     Extraocular Movements: Extraocular movements intact.     Conjunctiva/sclera: Conjunctivae normal.     Pupils: Pupils are equal, round, and reactive to light.  Neck:     Vascular: No carotid bruit.  Cardiovascular:     Rate and Rhythm: Normal rate and regular rhythm.     Pulses: Normal pulses.     Heart sounds: Normal heart sounds.  Pulmonary:     Effort: Pulmonary effort is normal.     Breath sounds: Normal breath sounds.  Abdominal:     General: Abdomen is flat. Bowel sounds are normal. There is no distension.     Palpations: Abdomen is soft. There is no mass.     Tenderness: There is no abdominal tenderness. There is no right CVA tenderness, left CVA tenderness, guarding or rebound.  Genitourinary:    Comments: Deferred exam Musculoskeletal:        General: Normal range of motion.     Cervical back: Normal range of motion and neck supple. No tenderness.     Right lower leg: No edema.     Left lower leg: No edema.  Lymphadenopathy:     Cervical: No cervical adenopathy.  Skin:    General: Skin is warm and dry.     Capillary Refill: Capillary refill takes less than 2 seconds.  Neurological:     General: No focal deficit present.     Mental Status: He is alert and oriented to person, place, and time. Mental status is at baseline.  Psychiatric:        Mood and Affect: Mood normal.        Behavior: Behavior normal.        Thought Content: Thought content normal.        Judgment: Judgment normal.        Diabetic foot exam was performed.  No deformities or other abnormal visual findings.  Posterior tibialis and dorsalis pulse intact bilaterally.  Intact to touch and monofilament testing bilaterally.     No results found for any visits on 07/22/24.     Assessment & Plan:    Routine  Health Maintenance and Physical Exam Discussed health promotion and safety including diet and exercise recommendations, dental health, and injury prevention. Tobacco cessation if applicable. Seat belts, sunscreen, smoke detectors, etc.    Immunization History  Administered Date(s) Administered   Janssen (J&J) SARS-COV-2 Vaccination 11/25/2019   Pfizer(Comirnaty)Fall Seasonal Vaccine 12 years and older 10/04/2021   Tdap 09/15/2009    Health Maintenance  Topic Date Due   Hepatitis C Screening  Never done   Influenza Vaccine  10/08/2024 (Originally 02/09/2024)   Zoster Vaccines- Shingrix (1 of 2) 10/20/2024 (Originally 06/10/2015)   COVID-19 Vaccine (3 - 2025-26 season) 03/10/2025 (Originally 03/11/2024)   Hepatitis B Vaccines 19-59 Average Risk (1 of 3 - 19+ 3-dose series) 07/19/2025 (Originally 06/09/1984)   Pneumococcal Vaccine: 50+ Years (1 of 2 - PCV) 07/22/2025 (Originally 06/09/1984)   OPHTHALMOLOGY EXAM  08/08/2024   HEMOGLOBIN A1C  08/28/2024   Diabetic kidney evaluation - eGFR measurement  02/25/2025   Diabetic kidney evaluation - Urine ACR  02/25/2025   FOOT EXAM  07/22/2025   Colonoscopy  06/17/2027   HIV Screening  Completed   HPV VACCINES  Aged Out   Meningococcal B Vaccine  Aged Out   DTaP/Tdap/Td  Discontinued        Problem List Items Addressed This Visit       Active Problems   Essential hypertension (Chronic)   Type 2 diabetes mellitus with hyperglycemia, with long-term current use of insulin  (HCC) (Chronic)   Relevant Orders   Comprehensive metabolic panel with GFR   CBC with Differential/Platelet   Hemoglobin A1c   Hypertriglyceridemia (Chronic)   Relevant Orders   Lipid panel   Other Visit Diagnoses       Annual physical exam    -  Primary   Relevant Orders   Comprehensive metabolic panel with GFR   CBC with Differential/Platelet   Lipid panel   TSH          PATIENT COUNSELING:    Encouraged smoking cessation.   Recommend that most  people either abstain from alcohol or drink within safe limits (<=14/week and <=4 drinks/occasion for males, <=7/weeks and <= 3 drinks/occasion for females) and that the risk for alcohol disorders and other health effects rises proportionally with the number of drinks per week and how often a drinker exceeds daily limits.   Diet: Recommend to adjust caloric intake to maintain or achieve ideal body weight, to reduce intake of dietary saturated fat and total fat, to limit sodium intake by avoiding high sodium foods and not adding table salt, and to maintain adequate dietary potassium and calcium  preferably from fresh fruits, vegetables, and low-fat dairy products.   Emphasized the importance of regular exercise.  Injury prevention: Recommend seatbelts, safety helmets, smoke detector, etc..   Dental health: Recommend regular tooth brushing, flossing, and dental visits.       Return in about 6 months (around 01/19/2025) for chronic disease management.     Waddell KATHEE Mon, NP  I,Emily Lagle,acting as a scribe for Waddell KATHEE Mon, NP.,have documented all relevant documentation on the behalf of Waddell KATHEE Mon, NP.  I, Waddell KATHEE Mon, NP, have reviewed all documentation for this visit. The documentation on 07/22/2024 for the exam, diagnosis, procedures, and orders are all accurate and complete.     [1]  Outpatient Medications Prior to Visit  Medication Sig   amLODipine  (NORVASC ) 5 MG tablet Take 1 tablet (5 mg total) by mouth in the morning and at bedtime.   atorvastatin  (LIPITOR) 20 MG tablet TAKE 1 TABLET BY MOUTH DAILY   b complex vitamins capsule Take 1 capsule by mouth daily.   carvedilol  (COREG ) 25 MG tablet Take 1 tablet (25 mg total) by mouth 2 (two) times daily with a meal.   cloNIDine  (CATAPRES ) 0.1 MG tablet Take 1 tablet (0.1 mg total) by mouth 2 (two) times daily.   Continuous Glucose Sensor (FREESTYLE LIBRE 3 PLUS SENSOR) MISC Change  sensor every 15 days.   fenofibrate  160 MG  tablet Take 1 tablet (160 mg total) by mouth daily.   folic acid  (FOLVITE ) 1 MG tablet Take 1 mg by mouth daily.   hydrALAZINE  (APRESOLINE ) 100 MG tablet TAKE 1 TABLET BY MOUTH EVERY 8 HOURS **APPOINTMENT NEEDED FOR FURTHER REFILLS**   HYDROcodone -acetaminophen  (NORCO/VICODIN) 5-325 MG tablet Take 1 tablet by mouth every 6 (six) hours as needed for moderate pain (pain score 4-6).   Insulin  Pen Needle (DROPLET PEN NEEDLES) 32G X 4 MM MISC USE WITH INSULIN    LANTUS  SOLOSTAR 100 UNIT/ML Solostar Pen INJECT 16 UNITS UNDER TEH SKIN DAILY   losartan  (COZAAR ) 100 MG tablet Take 1 tablet (100 mg total) by mouth daily.   metFORMIN  (GLUCOPHAGE -XR) 500 MG 24 hr tablet TAKE ONE TABLET BY MOUTH EVERY MORNING AND TAKE TWO TABLETS EVERY EVENING   Multiple Vitamin (MULTIVITAMIN WITH MINERALS) TABS tablet Take 1 tablet by mouth daily.   Omega-3 Fatty Acids (FISH OIL TRIPLE STRENGTH) 1400 MG CAPS Take 1,400 mg by mouth daily.   Psyllium (METAMUCIL PO) Take 2 Scoops by mouth daily.   sildenafil  (VIAGRA ) 50 MG tablet Take 1 tablet (50 mg total) by mouth daily as needed.   spironolactone  (ALDACTONE ) 25 MG tablet Take 1 tablet (25 mg total) by mouth daily.   No facility-administered medications prior to visit.   "

## 2024-07-22 NOTE — Progress Notes (Signed)
 Called and spoke to pt. Pt has been scheduled for Monday, 11/11/24. Pt made aware and verbalized understanding, NFN.

## 2024-07-24 ENCOUNTER — Other Ambulatory Visit: Payer: Self-pay | Admitting: Family Medicine

## 2024-07-24 ENCOUNTER — Ambulatory Visit: Payer: Self-pay | Admitting: Family Medicine

## 2024-07-24 DIAGNOSIS — E1165 Type 2 diabetes mellitus with hyperglycemia: Secondary | ICD-10-CM

## 2024-08-15 ENCOUNTER — Ambulatory Visit: Admitting: Adult Health

## 2024-11-11 ENCOUNTER — Ambulatory Visit: Admitting: Adult Health

## 2025-01-20 ENCOUNTER — Ambulatory Visit: Admitting: Family Medicine
